# Patient Record
Sex: Female | Born: 1980
Health system: Southern US, Community
[De-identification: ages and names within clinical notes are randomized; demographics above are authoritative.]

## PROBLEM LIST (undated history)

## (undated) DIAGNOSIS — D071 Carcinoma in situ of vulva: Secondary | ICD-10-CM

---

## 2002-03-27 ENCOUNTER — Other Ambulatory Visit: Admission: RE | Admit: 2002-03-27 | Discharge: 2002-03-27 | Payer: Self-pay | Admitting: *Deleted

## 2003-04-24 ENCOUNTER — Other Ambulatory Visit: Admission: RE | Admit: 2003-04-24 | Discharge: 2003-04-24 | Payer: Self-pay | Admitting: *Deleted

## 2003-11-05 ENCOUNTER — Other Ambulatory Visit: Admission: RE | Admit: 2003-11-05 | Discharge: 2003-11-05 | Payer: Self-pay | Admitting: Obstetrics and Gynecology

## 2004-03-26 ENCOUNTER — Other Ambulatory Visit: Admission: RE | Admit: 2004-03-26 | Discharge: 2004-03-26 | Payer: Self-pay | Admitting: Obstetrics and Gynecology

## 2004-10-15 ENCOUNTER — Other Ambulatory Visit: Admission: RE | Admit: 2004-10-15 | Discharge: 2004-10-15 | Payer: Self-pay | Admitting: Obstetrics and Gynecology

## 2005-01-08 ENCOUNTER — Other Ambulatory Visit: Admission: RE | Admit: 2005-01-08 | Discharge: 2005-01-08 | Payer: Self-pay | Admitting: Obstetrics and Gynecology

## 2005-07-27 ENCOUNTER — Other Ambulatory Visit: Admission: RE | Admit: 2005-07-27 | Discharge: 2005-07-27 | Payer: Self-pay | Admitting: Obstetrics and Gynecology

## 2006-11-01 ENCOUNTER — Ambulatory Visit (HOSPITAL_COMMUNITY): Admission: RE | Admit: 2006-11-01 | Discharge: 2006-11-01 | Payer: Self-pay | Admitting: Obstetrics and Gynecology

## 2007-04-15 ENCOUNTER — Inpatient Hospital Stay (HOSPITAL_COMMUNITY): Admission: AD | Admit: 2007-04-15 | Discharge: 2007-04-17 | Payer: Self-pay | Admitting: Obstetrics and Gynecology

## 2008-06-17 LAB — CONVERTED CEMR LAB: Pap Smear: NORMAL

## 2009-12-31 ENCOUNTER — Encounter: Payer: Self-pay | Admitting: Internal Medicine

## 2009-12-31 ENCOUNTER — Ambulatory Visit: Payer: Self-pay | Admitting: Internal Medicine

## 2009-12-31 DIAGNOSIS — K219 Gastro-esophageal reflux disease without esophagitis: Secondary | ICD-10-CM

## 2009-12-31 DIAGNOSIS — R079 Chest pain, unspecified: Secondary | ICD-10-CM

## 2010-06-16 NOTE — Assessment & Plan Note (Signed)
Summary: NEW PT/[facility]/#/LB   Vital Signs:  Patient profile:   30 year old female Height:      66 inches (167.64 cm) Weight:      136.50 pounds (62.05 kg) BMI:     22.11 O2 Sat:      98 % on Room air Temp:     99.1 degrees F (37.28 degrees C) oral Pulse rate:   79 / minute Pulse rhythm:   regular Resp:     16 per minute BP sitting:   120 / 74  (left arm) Cuff size:   regular  Vitals Entered By: Brenton Grills MA (December 31, 2009 3:58 PM)  O2 Flow:  Room air CC: New Pt/Chest pain since Saturday/aj, Heartburn Is Patient Diabetic? No Pain Assessment Patient in pain? no        Primary Care Provider:  Etta Grandchild MD  CC:  New Pt/Chest pain since Saturday/aj and Heartburn.  History of Present Illness:       This is a 30 year old female who presents with Chest pain.  The symptoms began 4 days ago.  On a scale of 1 to 10, the intensity is described as a 2.  The patient reports resting chest pain and indigestion, but denies exertional chest pain, nausea, vomiting, diaphoresis, shortness of breath, palpitations, dizziness, light headedness, and syncope.  The pain is described as intermittent and dull.  The pain is located in the substernal area and the pain does not radiate.  Episodes of chest pain last 10-20 minutes.  The pain is relieved or improved with antacids.    Heartburn      The patient also presents with Heartburn.  The symptoms began 4 days ago.  The intensity is described as moderate.  The patient reports acid reflux and chest pain, but denies sour taste in mouth, epigastric pain, trouble swallowing, weight loss, and weight gain.  The patient denies the following alarm features: melena, dysphagia, hematemesis, vomiting, involuntary weight loss >5%, and history of anemia.  Symptoms are worse with alcohol and spicy foods.  Prior evaluation has included no diagnostic studies.  Treatment that was tried and either found to be ineffective or stopped due to problems include dietary  changes and an antacid.    Preventive Screening-Counseling & Management  Alcohol-Tobacco     Alcohol drinks/day: 2     Alcohol type: beer     >5/day in last 3 mos: no     Alcohol Counseling: not indicated; use of alcohol is not excessive or problematic     Feels need to cut down: no     Feels annoyed by complaints: no     Feels guilty re: drinking: no     Needs 'eye opener' in am: no     Smoking Status: never  Caffeine-Diet-Exercise     Does Patient Exercise: yes  Hep-HIV-STD-Contraception     Hepatitis Risk: no risk noted     HIV Risk: no risk noted     STD Risk: no risk noted  Safety-Violence-Falls     Seat Belt Use: yes     Helmet Use: yes     Firearms in the Home: no firearms in the home     Smoke Detectors: yes     Violence in the Home: no risk noted      Sexual History:  currently monogamous.        Drug Use:  no.        Blood Transfusions:  no.  Current Medications (verified): 1)  None  Allergies (verified): No Known Drug Allergies  Past History:  Past Medical History: Unremarkable  Past Surgical History: Denies surgical history  Family History: Family History Lung cancer  Social History: Occupation: Chief Technology Officer for [employer] Married Never Smoked Alcohol use-yes Drug use-no Regular exercise-yes Smoking Status:  never Hepatitis Risk:  no risk noted HIV Risk:  no risk noted STD Risk:  no risk noted Seat Belt Use:  yes Sexual History:  currently monogamous Blood Transfusions:  no Drug Use:  no Does Patient Exercise:  yes  Review of Systems       The patient complains of severe indigestion/heartburn.  The patient denies anorexia, fever, weight loss, syncope, dyspnea on exertion, peripheral edema, prolonged cough, headaches, hemoptysis, abdominal pain, melena, hematochezia, suspicious skin lesions, difficulty walking, depression, enlarged lymph nodes, and angioedema.    Physical Exam  General:  alert, well-developed,  well-nourished, well-hydrated, appropriate dress, normal appearance, healthy-appearing, cooperative to examination, and good hygiene.   Head:  normocephalic, atraumatic, no abnormalities observed, and no abnormalities palpated.   Eyes:  No corneal or conjunctival inflammation noted. EOMI. Perrla. Funduscopic exam benign, without hemorrhages, exudates or papilledema. Vision grossly normal. Neck:  supple, full ROM, no masses, no thyromegaly, no JVD, normal carotid upstroke, no carotid bruits, no cervical lymphadenopathy, and no neck tenderness.   Lungs:  normal respiratory effort, no intercostal retractions, no accessory muscle use, normal breath sounds, no dullness, no fremitus, no crackles, and no wheezes.   Heart:  normal rate, regular rhythm, no murmur, no gallop, no rub, and no JVD.   Abdomen:  soft, non-tender, normal bowel sounds, no distention, no masses, no guarding, no rigidity, no rebound tenderness, no abdominal hernia, no inguinal hernia, no hepatomegaly, and no splenomegaly.   Msk:  No deformity or scoliosis noted of thoracic or lumbar spine.   Pulses:  R and L carotid,radial,femoral,dorsalis pedis and posterior tibial pulses are full and equal bilaterally Extremities:  No clubbing, cyanosis, edema, or deformity noted with normal full range of motion of all joints.   Neurologic:  No cranial nerve deficits noted. Station and gait are normal. Plantar reflexes are down-going bilaterally. DTRs are symmetrical throughout. Sensory, motor and coordinative functions appear intact. Skin:  turgor normal, color normal, no rashes, no suspicious lesions, no ecchymoses, no petechiae, no purpura, no ulcerations, and no edema.   Cervical Nodes:  no anterior cervical adenopathy and no posterior cervical adenopathy.   Axillary Nodes:  no R axillary adenopathy and no L axillary adenopathy.   Psych:  Cognition and judgment appear intact. Alert and cooperative with normal attention span and concentration. No  apparent delusions, illusions, hallucinations   Impression & Recommendations:  Problem # 1:  CHEST PAIN (ICD-786.50) Assessment New  this sounds like it is caused by GERD, her EKG is normal today.  Orders: EKG w/ Interpretation (93000)  Problem # 2:  GERD (ICD-530.81) Assessment: New  Her updated medication list for this problem includes:    Dexilant 60 Mg Cpdr (Dexlansoprazole) ..... One by mouth once daily for heartburn  Orders: EKG w/ Interpretation (93000)  Complete Medication List: 1)  Dexilant 60 Mg Cpdr (Dexlansoprazole) .... One by mouth once daily for heartburn  Patient Instructions: 1)  Please schedule a follow-up appointment in 2 months. 2)  Avoid foods high in acid (tomatoes, citrus juices, spicy foods). Avoid eating within two hours of lying down or before exercising. Do not over eat; try smaller more frequent meals. Elevate head of  bed twelve inches when sleeping. Prescriptions: DEXILANT 60 MG CPDR (DEXLANSOPRAZOLE) One by mouth once daily for heartburn  #30 x 0   Entered and Authorized by:   Etta Grandchild MD   Signed by:   Etta Grandchild MD on 12/31/2009   Method used:   Samples Given   RxID:   [id number]   Preventive Care Screening  Pap Smear:    Date:  06/17/2008    Results:  normal

## 2011-02-22 LAB — CBC
Hemoglobin: 9.1 — ABNORMAL LOW
MCHC: 34.1
RBC: 2.74 — ABNORMAL LOW
RDW: 13.8

## 2011-02-22 LAB — COMPREHENSIVE METABOLIC PANEL
ALT: 15
AST: 28
Alkaline Phosphatase: 92
Calcium: 8.8
GFR calc Af Amer: >60
Glucose, Bld: 118 — ABNORMAL HIGH
Potassium: 3.3 — ABNORMAL LOW
Sodium: 137
Total Protein: 5.2 — ABNORMAL LOW

## 2011-02-23 LAB — COMPREHENSIVE METABOLIC PANEL
ALT: 14
BUN: 6
CO2: 20
Calcium: 8.6
Creatinine, Ser: 0.61
GFR calc non Af Amer: >60
Glucose, Bld: 108 — ABNORMAL HIGH
Total Protein: 6.1

## 2011-02-23 LAB — URINALYSIS, DIPSTICK ONLY
Bilirubin Urine: NEGATIVE
Leukocytes, UA: NEGATIVE
Nitrite: NEGATIVE
Specific Gravity, Urine: <1.005 — ABNORMAL LOW
Urobilinogen, UA: 0.2

## 2011-02-23 LAB — CBC
HCT: 34.6 — ABNORMAL LOW
Hemoglobin: 12
MCHC: 33.8
MCHC: 34.7
MCV: 96
RBC: 2.77 — ABNORMAL LOW
RBC: 3.6 — ABNORMAL LOW
RDW: 13.5

## 2011-02-23 LAB — LACTATE DEHYDROGENASE: LDH: 157

## 2011-02-23 LAB — RPR: RPR Ser Ql: NONREACTIVE

## 2011-02-23 LAB — URIC ACID: Uric Acid, Serum: 5.3

## 2011-04-19 ENCOUNTER — Emergency Department
Admission: EM | Admit: 2011-04-19 | Discharge: 2011-04-19 | Disposition: A | Discharge status: Home / Self Care | Payer: 59 | Source: Home / Self Care | Attending: Family Medicine | Admitting: Family Medicine

## 2011-04-19 ENCOUNTER — Encounter: Payer: Self-pay | Admitting: Emergency Medicine

## 2011-04-19 DIAGNOSIS — J069 Acute upper respiratory infection, unspecified: Secondary | ICD-10-CM

## 2011-04-19 MED ORDER — AZITHROMYCIN 250 MG PO TABS: ORAL_TABLET | ORAL | Status: AC

## 2011-04-19 MED ORDER — BENZONATATE 200 MG PO CAPS: 200.0000 mg | ORAL_CAPSULE | Freq: Every day | ORAL | Status: AC

## 2011-04-19 NOTE — ED Provider Notes (Signed)
History     CSN: [id number] Arrival date & time: 04/19/2011  1:07 PM   First MD Initiated Contact with Patient 04/19/11 1320      Chief Complaint  Patient presents with  . URI      HPI Comments: Complains of mild URI symptoms for one week that started with a scratchy throat, followed by a cough, worse at night.  No fever.  She has not had a flu shot, and does not remember her last tetanus shot.  Patient is a 30 y.o. female presenting with URI.  URI    History reviewed. No pertinent past medical history.  No past surgical history on file.  No family history on file.  History  Substance Use Topics  . Smoking status: Not on file  . Smokeless tobacco: Not on file  . Alcohol Use: Not on file    OB History    Grav Para Term Preterm Abortions TAB SAB Ect Mult Living                  Review of Systems No sore throat at present.  cough No pleuritic pain No wheezing + mild nasal congestion No post-nasal drainage No sinus pain/pressure No itchy/red eyes ? earache No hemoptysis No SOB No fever/chills No nausea No vomiting No abdominal pain No diarrhea No urinary symptoms No skin rashes + fatigue No myalgias No headache Used OTC meds without relief  Allergies  Review of patient's allergies indicates no known allergies.  Home Medications   Current Outpatient Rx  Name Route Sig Dispense Refill  . AZITHROMYCIN 250 MG PO TABS  Take 2 tabs today; then begin one tab once daily for 4 more days. (Rx void after 04/26/11)   DEA # [id number] each 0  . BENZONATATE 200 MG PO CAPS Oral Take 1 capsule (200 mg total) by mouth at bedtime. Take as needed for cough 12 capsule 0    BP 130/76  Pulse 76  Temp(Src) 99.1 F (37.3 C) (Oral)  Resp 16  Ht 5\' 6"  (1.676 m)  Wt 144 lb (65.318 kg)  BMI 23.24 kg/m2  SpO2 100%  LMP 04/05/2011  Physical Exam Nursing notes and Vital Signs reviewed. Appearance:  Patient appears healthy, stated age, and in no acute distress    Eyes:  Pupils are equal, round, and reactive to light and accomodation.  Extraocular movement is intact.  Conjunctivae are not inflamed  Ears:  Canals normal.  Tympanic membranes normal.  Nose:  Mildly congested turbinates.  No sinus tenderness.   Pharynx:  Normal  Neck:  Supple.  Slightly tender shotty anterior/posterior nodes are palpated bilaterally  Lungs:  Clear to auscultation.  Breath sounds are equal.  Heart:  Regular rate and rhythm without murmurs, rubs, or gallops.  Abdomen:  Nontender without masses or hepatosplenomegaly.  Bowel sounds are present.  No CVA or flank tenderness.  Extremities:  No edema.  Pedal pulses are full and equal.  Skin:  No rash present.   ED Course  Procedures none      1. Acute upper respiratory infections of unspecified site       MDM  There is no evidence of bacterial infection today.   Treat symptomatically for now:  Increase fluid intake, begin expectorant/decongestant, topical decongestant, saline nasal spray/saline irrigation, cough suppressant at bedtime. If fever/chills persist, or if not improving 5 days begin Z-pack (given Rx to hold).  Followup with PCP if not improving 7 to 10 days.  Recommend flu  shot and Tdap when well.       Donna Christen, MD 04/19/11 [phone]

## 2011-04-19 NOTE — ED Notes (Signed)
Bi lateral ear pain, cough, scratchy throat x 1 week

## 2011-06-11 HISTORY — PX: DILATION AND CURETTAGE, DIAGNOSTIC / THERAPEUTIC: SUR384

## 2013-03-13 ENCOUNTER — Ambulatory Visit (INDEPENDENT_AMBULATORY_CARE_PROVIDER_SITE_OTHER): Payer: 59 | Admitting: Sports Medicine

## 2013-03-13 ENCOUNTER — Encounter: Payer: Self-pay | Admitting: Sports Medicine

## 2013-03-13 ENCOUNTER — Ambulatory Visit (INDEPENDENT_AMBULATORY_CARE_PROVIDER_SITE_OTHER): Payer: 59

## 2013-03-13 VITALS — BP 130/79 | HR 85 | Wt 140.0 lb

## 2013-03-13 DIAGNOSIS — M7061 Trochanteric bursitis, right hip: Secondary | ICD-10-CM

## 2013-03-13 DIAGNOSIS — M25559 Pain in unspecified hip: Secondary | ICD-10-CM

## 2013-03-13 DIAGNOSIS — M76899 Other specified enthesopathies of unspecified lower limb, excluding foot: Secondary | ICD-10-CM

## 2013-03-13 MED ORDER — MELOXICAM 15 MG PO TABS: ORAL_TABLET | ORAL | Status: DC

## 2013-03-13 NOTE — Progress Notes (Deleted)
  Subjective:    CC: Establish care.   HPI:   Past medical history, Surgical history, Family history not pertinant except as noted below, Social history, Allergies, and medications have been entered into the medical record, reviewed, and no changes needed.   Review of Systems: No headache, visual changes, nausea, vomiting, diarrhea, constipation, dizziness, abdominal pain, skin rash, fevers, chills, night sweats, swollen lymph nodes, weight loss, chest pain, body aches, joint swelling, muscle aches, shortness of breath, mood changes, visual or auditory hallucinations.  Objective:    General: Well Developed, well nourished, and in no acute distress.  Neuro: Alert and oriented x3, extra-ocular muscles intact, sensation grossly intact.  HEENT: Normocephalic, atraumatic, pupils equal round reactive to light, neck supple, no masses, no lymphadenopathy, thyroid nonpalpable.  Skin: Warm and dry, no rashes noted.  Cardiac: Regular rate and rhythm, no murmurs rubs or gallops.  Respiratory: Clear to auscultation bilaterally. Not using accessory muscles, speaking in full sentences.  Abdominal: Soft, nontender, nondistended, positive bowel sounds, no masses, no organomegaly.  Musculoskeletal: Shoulder, elbow, wrist, hip, knee, ankle stable, and with full range of motion.    Impression and Recommendations:    The patient was counselled, risk factors were discussed, anticipatory guidance given.

## 2013-03-13 NOTE — Patient Instructions (Signed)
Hip Rehabilitation Protocol:  1.  Side leg raises.  3x30 with no weight, then 3x15 with 2 lb ankle weight, then 3x15 with 5 lb ankle weight 2.  Standing hip rotation.  3x30 with no weight, then 3x15 with 2 lb ankle weight, then 3x15 with 5 lb ankle weight. 3.  Side step ups.  3x30 with no weight, then 3x15 with 5 lbs in backpack, then 3x15 with 10 lbs in backpack. 

## 2013-03-13 NOTE — Assessment & Plan Note (Signed)
Persistent, posttraumatic, and likely perseverative by abnormal biomechanics. Mobic, aggressive hip abductor rehabilitation. Hip and pelvic x-rays. Return in 4 weeks, injection if no better.

## 2013-03-13 NOTE — Progress Notes (Signed)
  Subjective:    CC: Hip pain  HPI: This is a pleasant 32 year old female here for evaluation of right hip pain. She slipped and fell down a flight of stairs about 8 months ago and had pain, bruising and swelling at the time. She treated the pain with ice but did not see a doctor initially. She has had no x-rays of the hip. She had a limp initially and was unable to run or exercise. Her pain in the right hip and lateral thigh was persistent for several months. It gradually improved but began to worsen again about 2 months ago. The pain is moderate and intermittent, worse when standing up out of a chair. She has no pain with walking or running.   Past medical history, Surgical history, Family history not pertinant except as noted below, Social history, Allergies, and medications have been entered into the medical record, reviewed, and no changes needed.   Review of Systems: No headache, visual changes, nausea, vomiting, diarrhea, constipation, dizziness, abdominal pain, skin rash, fevers, chills, night sweats, swollen lymph nodes, weight loss, chest pain, body aches, joint swelling, muscle aches, shortness of breath, mood changes, visual or auditory hallucinations.  Objective:    General: Well Developed, well nourished, and in no acute distress.  Neuro: Alert and oriented x3, extra-ocular muscles intact, sensation grossly intact.  HEENT: Normocephalic, atraumatic, pupils equal round reactive to light, neck supple, no masses, no lymphadenopathy, thyroid nonpalpable.  Skin: Warm and dry, no rashes noted.  Cardiac: Regular rate and rhythm, no murmurs rubs or gallops.  Respiratory: Clear to auscultation bilaterally. Not using accessory muscles, speaking in full sentences.  Abdominal: Soft, nontender, nondistended, positive bowel sounds, no masses, no organomegaly.  Right Hip: ROM IR: 45 Deg, ER: 45 Deg, Flexion: 120 Deg, Extension: 100 Deg, Abduction: 30 Deg, Adduction: 45 Deg Strength IR: 5/5, ER:  5/5, Flexion: 5/5, Extension: 4/5, Abduction: 4-/5, Adduction: 5/5 Pelvic alignment unremarkable to inspection and palpation. Standing hip rotation and gait without trendelenburg sign / unsteadiness. Tender to palpation over the greater trochanter and lateral thigh Pain with FABER No SI joint tenderness and normal minimal SI movement.  X-rays were reviewed and are negative for fracture or degenerative changes.  Impression and Recommendations:    The patient was counselled, risk factors were discussed, anticipatory guidance given.  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  Plan: 1. X-ray right hip today 2. Meloxicam daily for 2 weeks, then as needed for pain 3. Daily home exercises to strengthen hip abductors 4. Return for follow-up in 4 weeks to evaluate response and need for glucocorticoid injection  This note was originally written by Karin Lieu MS3.

## 2013-04-10 ENCOUNTER — Ambulatory Visit: Payer: 59 | Admitting: Sports Medicine

## 2014-05-17 HISTORY — PX: LEEP: SHX91

## 2014-08-20 ENCOUNTER — Encounter: Payer: Self-pay | Admitting: Sports Medicine

## 2014-08-20 ENCOUNTER — Ambulatory Visit (INDEPENDENT_AMBULATORY_CARE_PROVIDER_SITE_OTHER): Payer: 59

## 2014-08-20 ENCOUNTER — Ambulatory Visit (INDEPENDENT_AMBULATORY_CARE_PROVIDER_SITE_OTHER): Payer: 59 | Admitting: Sports Medicine

## 2014-08-20 VITALS — BP 138/86 | HR 85 | Ht 66.0 in | Wt 149.0 lb

## 2014-08-20 DIAGNOSIS — M25572 Pain in left ankle and joints of left foot: Secondary | ICD-10-CM

## 2014-08-20 DIAGNOSIS — M25475 Effusion, left foot: Secondary | ICD-10-CM | POA: Diagnosis not present

## 2014-08-20 DIAGNOSIS — M2012 Hallux valgus (acquired), left foot: Secondary | ICD-10-CM

## 2014-08-20 MED ORDER — MELOXICAM 15 MG PO TABS: ORAL_TABLET | ORAL | Status: DC

## 2014-08-20 NOTE — Progress Notes (Signed)
   Subjective:    I'm seeing this patient as a consultation for:  Dr. Ronnald Ramp  CC: left toe pain  HPI: This is a pleasant 34 year old female, she comes in with a 3 month history of pain and swelling in her left great toe, metatarsophalangeal joint, moderate, persistent without radiation. Pain began suddenly without trauma, she denies any fevers or chills.  Past medical history, Surgical history, Family history not pertinant except as noted below, Social history, Allergies, and medications have been entered into the medical record, reviewed, and no changes needed.   Review of Systems: No headache, visual changes, nausea, vomiting, diarrhea, constipation, dizziness, abdominal pain, skin rash, fevers, chills, night sweats, weight loss, swollen lymph nodes, body aches, joint swelling, muscle aches, chest pain, shortness of breath, mood changes, visual or auditory hallucinations.   Objective:   General: Well Developed, well nourished, and in no acute distress.  Neuro/Psych: Alert and oriented x3, extra-ocular muscles intact, able to move all 4 extremities, sensation grossly intact. Skin: Warm and dry, no rashes noted.  Respiratory: Not using accessory muscles, speaking in full sentences, trachea midline.  Cardiovascular: Pulses palpable, no extremity edema. Abdomen: Does not appear distended. Left Foot: No visible erythema or swelling. Range of motion is full in all directions. Strength is 5/5 in all directions. No hallux valgus. Mild pes cavus bilaterally No abnormal callus noted. No pain over the navicular prominence, or base of fifth metatarsal. No tenderness to palpation of the calcaneal insertion of plantar fascia. No pain at the Achilles insertion. No pain over the calcaneal bursa. No pain of the retrocalcaneal bursa. Visible swelling with mild erythema over the first metatarsophalangeal joint, with visible hallux valgus without hallux limitus or rigidus. No tenderness palpation over  interphalangeal joints. No pain with compression of the metatarsal heads. Neurovascularly intact distally.  Procedure: Real-time Ultrasound Guided Injection of left first metatarsophalangeal joint Device: GE Logiq E  Verbal informed consent obtained.  Time-out conducted.  Noted no overlying erythema, induration, or other signs of local infection.  Skin prepped in a sterile fashion.  Local anesthesia: Topical Ethyl chloride.  With sterile technique and under real time ultrasound guidance:  Noted extensive synovitis, 25-gauge needle advanced into the joint, 0.5 mL kenalog 40, 0.5 mL lidocaine injected easily. Completed without difficulty  Pain immediately resolved suggesting accurate placement of the medication.  Advised to call if fevers/chills, erythema, induration, drainage, or persistent bleeding.  Images permanently stored and available for review in the ultrasound unit.  Impression: Technically successful ultrasound guided injection.  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  Impression and Recommendations:   This case required medical decision making of moderate complexity.

## 2014-08-20 NOTE — Assessment & Plan Note (Signed)
Considering duration for 3 months I do not suspect gouty arthropathy. This is likely synovitis from hallux valgus. Injection as above. Meloxicam. X-rays. Return for custom orthotics.

## 2014-09-05 ENCOUNTER — Encounter: Payer: Self-pay | Admitting: Sports Medicine

## 2014-09-05 ENCOUNTER — Ambulatory Visit (INDEPENDENT_AMBULATORY_CARE_PROVIDER_SITE_OTHER): Payer: 59 | Admitting: Sports Medicine

## 2014-09-05 VITALS — BP 123/77 | HR 82 | Wt 146.0 lb

## 2014-09-05 DIAGNOSIS — M2012 Hallux valgus (acquired), left foot: Secondary | ICD-10-CM | POA: Diagnosis not present

## 2014-09-05 NOTE — Assessment & Plan Note (Signed)
Pain-free after metatarsophalangeal injection, custom orthotics as above.  Return in one month.

## 2014-09-05 NOTE — Progress Notes (Signed)

## 2014-10-03 ENCOUNTER — Ambulatory Visit: Payer: 59 | Admitting: Sports Medicine

## 2014-10-15 ENCOUNTER — Encounter: Payer: Self-pay | Admitting: Family Medicine

## 2014-10-15 ENCOUNTER — Ambulatory Visit (INDEPENDENT_AMBULATORY_CARE_PROVIDER_SITE_OTHER): Payer: 59 | Admitting: Family Medicine

## 2014-10-15 VITALS — BP 132/81 | HR 86 | Wt 147.0 lb

## 2014-10-15 DIAGNOSIS — J029 Acute pharyngitis, unspecified: Secondary | ICD-10-CM | POA: Diagnosis not present

## 2014-10-15 LAB — POCT RAPID STREP A (OFFICE): Rapid Strep A Screen: NEGATIVE

## 2014-10-15 NOTE — Progress Notes (Signed)
CC: Debbie Hayden is a 34 y.o. female is here for Sore Throat   Subjective: HPI:  Awoke with a sore throat at 2 AM today, moderate in severity, greatly improved by ibuprofen. No other interventions. Reports being in her regular state of health when she went to bed last night. Denies difficulty swallowing, choking, shortness of breath, change in voice, rash and headache nor nasal congestion. No fevers or chills.   Review Of Systems Outlined In HPI  No past medical history on file.  No past surgical history on file. No family history on file.  History   Social History  . Marital Status: Married    Spouse Name: N/A  . Number of Children: N/A  . Years of Education: N/A   Occupational History  . Not on file.   Social History Main Topics  . Smoking status: Never Smoker   . Smokeless tobacco: Not on file  . Alcohol Use: Not on file  . Drug Use: Not on file  . Sexual Activity: Not on file   Other Topics Concern  . Not on file   Social History Narrative     Objective: BP 132/81 mmHg  Pulse 86  Wt 147 lb (66.679 kg)  General: Alert and Oriented, No Acute Distress HEENT: Pupils equal, round, reactive to light. Conjunctivae clear.  External ears unremarkable, canals clear with intact TMs with appropriate landmarks.  Middle ear appears open without effusion. Pink inferior turbinates.  Moist mucous membranes, pharynx without inflammation nor lesions.  Neck supple without palpable lymphadenopathy nor abnormal masses. Uvula is midline Lungs: Clear to couple work of breathing Cardiac: Regular rate and rhythm.   Assessment & Plan: Debbie Hayden was seen today for sore throat.  Diagnoses and all orders for this visit:  Acute pharyngitis, unspecified pharyngitis type Orders: -     POCT rapid strep A  Sore throat   Viral pharyngitis: She is happy with response to ibuprofen and will continue using this on an as-needed basis. Discussed signs and symptoms that would be suggestive  of a bacterial infection and to call me if these symptoms arise for a antibiotic  No Follow-up on file.

## 2015-02-06 ENCOUNTER — Encounter: Payer: Self-pay | Admitting: Sports Medicine

## 2015-02-06 ENCOUNTER — Ambulatory Visit (INDEPENDENT_AMBULATORY_CARE_PROVIDER_SITE_OTHER): Payer: 59 | Admitting: Sports Medicine

## 2015-02-06 VITALS — BP 128/76 | HR 82 | Wt 149.0 lb

## 2015-02-06 DIAGNOSIS — M659 Synovitis and tenosynovitis, unspecified: Secondary | ICD-10-CM

## 2015-02-06 DIAGNOSIS — M65979 Unspecified synovitis and tenosynovitis, unspecified ankle and foot: Secondary | ICD-10-CM | POA: Insufficient documentation

## 2015-02-06 NOTE — Assessment & Plan Note (Signed)
Noted joint effusion in the third MTP, lesser so in the second and fourth, there was also no increased Doppler flow along the periosteum, and no tendon sheath effusion in the extensor digitorum. Continue meloxicam. Wear supportive/rigid soled shoes, and icing 20 minutes 3-4 times per day. She does have a trip coming up to [place].  After further discussion we will inject the third MTP. Return after trip to [place].

## 2015-02-06 NOTE — Progress Notes (Signed)
  Subjective:    CC: left foot pain  HPI: This is a pleasant 34 year old female, she did a 3 mile walk, and then played soccer with her son, only to indent with swelling and pain over the dorsum of her left foot, localized over the shaft of the third metatarsal down to the metatarsophalangeal joint. Pain is severe, persistent with radiation into the ankle.  Past medical history, Surgical history, Family history not pertinant except as noted below, Social history, Allergies, and medications have been entered into the medical record, reviewed, and no changes needed.   Review of Systems: No fevers, chills, night sweats, weight loss, chest pain, or shortness of breath.   Objective:    General: Well Developed, well nourished, and in no acute distress.  Neuro: Alert and oriented x3, extra-ocular muscles intact, sensation grossly intact.  HEENT: Normocephalic, atraumatic, pupils equal round reactive to light, neck supple, no masses, no lymphadenopathy, thyroid nonpalpable.  Skin: Warm and dry, no rashes. Cardiac: Regular rate and rhythm, no murmurs rubs or gallops, no lower extremity edema.  Respiratory: Clear to auscultation bilaterally. Not using accessory muscles, speaking in full sentences. Left Foot: Visibly swollen. Range of motion is full in all directions. Strength is 5/5 in all directions. No hallux valgus. No pes cavus or pes planus. No abnormal callus noted. No pain over the navicular prominence, or base of fifth metatarsal. No tenderness to palpation of the calcaneal insertion of plantar fascia. No pain at the Achilles insertion. No pain over the calcaneal bursa. No pain of the retrocalcaneal bursa. No tenderness to palpation over the tarsals, metatarsals, or phalanges. No hallux rigidus or limitus. Tender to palpation over the dorsum of the third metatarsal as well as the third metatarsophalangeal joint. No pain with compression of the metatarsal heads. Neurovascularly intact  distally.  Procedure: Real-time Ultrasound Guided Injection of left third MTP Device: GE Logiq E  Verbal informed consent obtained.  Time-out conducted.  Noted no overlying erythema, induration, or other signs of local infection.  Skin prepped in a sterile fashion.  Local anesthesia: Topical Ethyl chloride.  With sterile technique and under real time ultrasound guidance:  Noted left third MTP effusion, 25-gauge needle advanced here, and 1/2 mL Kenalog 40, 1/2 mL lidocaine injected easily. Completed without difficulty  Pain immediately resolved suggesting accurate placement of the medication.  Advised to call if fevers/chills, erythema, induration, drainage, or persistent bleeding.  Images permanently stored and available for review in the ultrasound unit.  Impression: Technically successful ultrasound guided injection.  Impression and Recommendations:

## 2015-06-19 MED FILL — TRI-LO-SPRINTEC TABLET: 0.18/0.215/ | 84 days supply | Qty: 84 | Fill #2

## 2015-07-08 DIAGNOSIS — Z01419 Encounter for gynecological examination (general) (routine) without abnormal findings: Secondary | ICD-10-CM | POA: Diagnosis not present

## 2015-07-08 DIAGNOSIS — C530 Malignant neoplasm of endocervix: Secondary | ICD-10-CM | POA: Diagnosis not present

## 2015-07-08 DIAGNOSIS — Z8541 Personal history of malignant neoplasm of cervix uteri: Secondary | ICD-10-CM | POA: Diagnosis not present

## 2015-09-10 MED FILL — TRI-LO-SPRINTEC TABLET: 0.18/0.215/ | 84 days supply | Qty: 84 | Fill #3

## 2015-12-04 MED FILL — TRI-LO-SPRINTEC TABLET: 0.18/0.215/ | 28 days supply | Qty: 28 | Fill #4

## 2015-12-18 ENCOUNTER — Encounter: Payer: Self-pay | Admitting: Sports Medicine

## 2015-12-18 ENCOUNTER — Ambulatory Visit (INDEPENDENT_AMBULATORY_CARE_PROVIDER_SITE_OTHER): Payer: 59 | Admitting: Sports Medicine

## 2015-12-18 VITALS — BP 138/85 | HR 90 | Resp 18 | Wt 156.3 lb

## 2015-12-18 DIAGNOSIS — R635 Abnormal weight gain: Secondary | ICD-10-CM | POA: Diagnosis not present

## 2015-12-18 DIAGNOSIS — Z0000 Encounter for general adult medical examination without abnormal findings: Secondary | ICD-10-CM | POA: Insufficient documentation

## 2015-12-18 DIAGNOSIS — Z299 Encounter for prophylactic measures, unspecified: Secondary | ICD-10-CM

## 2015-12-18 DIAGNOSIS — Z23 Encounter for immunization: Secondary | ICD-10-CM | POA: Diagnosis not present

## 2015-12-18 LAB — LIPID PANEL
Cholesterol: 202 mg/dL — ABNORMAL HIGH (ref 125–200)
HDL: 108 mg/dL (ref >=46)
LDL Cholesterol: 74 mg/dL (ref <130)
Total CHOL/HDL Ratio: 1.9 Ratio (ref <=5.0)
Triglycerides: 99 mg/dL (ref <150)
VLDL: 20 mg/dL (ref <30)

## 2015-12-18 LAB — COMPREHENSIVE METABOLIC PANEL WITH GFR
ALT: 33 U/L — ABNORMAL HIGH (ref 6–29)
AST: 25 U/L (ref 10–30)
Albumin: 4.5 g/dL (ref 3.6–5.1)
Alkaline Phosphatase: 40 U/L (ref 33–115)
Calcium: 9.3 mg/dL (ref 8.6–10.2)
Sodium: 138 mmol/L (ref 135–146)
Total Bilirubin: 0.5 mg/dL (ref 0.2–1.2)
Total Protein: 7.4 g/dL (ref 6.1–8.1)

## 2015-12-18 LAB — CBC
HCT: 38.7 % (ref 35.0–45.0)
Hemoglobin: 12.7 g/dL (ref 11.7–15.5)
MCH: 32.2 pg (ref 27.0–33.0)
MCHC: 32.8 g/dL (ref 32.0–36.0)
MCV: 98.2 fL (ref 80.0–100.0)
MPV: 9.4 fL (ref 7.5–12.5)
Platelets: 227 10*3/uL (ref 140–400)
RBC: 3.94 MIL/uL (ref 3.80–5.10)
RDW: 12.6 % (ref 11.0–15.0)
WBC: 10 10*3/uL (ref 3.8–10.8)

## 2015-12-18 LAB — COMPREHENSIVE METABOLIC PANEL
BUN: 11 mg/dL (ref 7–25)
CO2: 24 mmol/L (ref 20–31)
Chloride: 102 mmol/L (ref 98–110)
Creat: 0.68 mg/dL (ref 0.50–1.10)
Glucose, Bld: 89 mg/dL (ref 65–99)
Potassium: 4.2 mmol/L (ref 3.5–5.3)

## 2015-12-18 LAB — TSH: TSH: 1.37 m[IU]/L

## 2015-12-18 MED ORDER — PHENTERMINE HCL 37.5 MG PO TABS: ORAL_TABLET | ORAL | 0 refills | Status: DC

## 2015-12-18 MED FILL — PHENTERMINE 37.5 MG TABLET: 37.5 | 30 days supply | Qty: 30 | Fill #0

## 2015-12-18 NOTE — Assessment & Plan Note (Signed)
Cervical cancer screening is coming up. Tetanus vaccine today.

## 2015-12-18 NOTE — Progress Notes (Signed)
  Subjective:    CC: Abnormal weight gain  HPI: This is a pleasant 35 year old female, she has struggled with gaining weight over the past few years despite riding an exercise, she is here to discuss pharmacologic assistance with weight loss. She does have bilateral foot and ankle pain  Past medical history, Surgical history, Family history not pertinant except as noted below, Social history, Allergies, and medications have been entered into the medical record, reviewed, and no changes needed.   Review of Systems: No fevers, chills, night sweats, weight loss, chest pain, or shortness of breath.   Objective:    General: Well Developed, well nourished, and in no acute distress.  Neuro: Alert and oriented x3, extra-ocular muscles intact, sensation grossly intact.  HEENT: Normocephalic, atraumatic, pupils equal round reactive to light, neck supple, no masses, no lymphadenopathy, thyroid nonpalpable.  Skin: Warm and dry, no rashes. Cardiac: Regular rate and rhythm, no murmurs rubs or gallops, no lower extremity edema.  Respiratory: Clear to auscultation bilaterally. Not using accessory muscles, speaking in full sentences.  Impression and Recommendations:    Abnormal weight gain Starting phentermine, return monthly for weight checks and refills. Checking routine blood work.  Preventive measure Cervical cancer screening is coming up. Tetanus vaccine today.  I spent 25 minutes with this patient, greater than 50% was face-to-face time counseling regarding the above diagnoses

## 2015-12-18 NOTE — Addendum Note (Signed)
Addended by: Elizabeth Sauer on: 12/18/2015 01:12 PM   Modules accepted: Orders

## 2015-12-18 NOTE — Assessment & Plan Note (Signed)
Starting phentermine, return monthly for weight checks and refills. Checking routine blood work.

## 2015-12-19 LAB — HEMOGLOBIN A1C
Hgb A1c MFr Bld: 4.9 % (ref <5.7)
Mean Plasma Glucose: 94 mg/dL

## 2015-12-19 LAB — HIV ANTIBODY (ROUTINE TESTING W REFLEX): HIV 1&2 Ab, 4th Generation: NONREACTIVE

## 2015-12-19 LAB — VITAMIN D 25 HYDROXY (VIT D DEFICIENCY, FRACTURES): Vit D, 25-Hydroxy: 40 ng/mL (ref 30–100)

## 2015-12-24 ENCOUNTER — Encounter: Payer: Self-pay | Admitting: Sports Medicine

## 2015-12-25 ENCOUNTER — Ambulatory Visit: Payer: 59 | Admitting: Sports Medicine

## 2016-01-01 MED FILL — TRI-LO-SPRINTEC TABLET: 0.18/0.215/ | 56 days supply | Qty: 56 | Fill #0

## 2016-01-15 ENCOUNTER — Ambulatory Visit (INDEPENDENT_AMBULATORY_CARE_PROVIDER_SITE_OTHER): Payer: 59 | Admitting: Sports Medicine

## 2016-01-15 ENCOUNTER — Ambulatory Visit: Payer: 59 | Admitting: Sports Medicine

## 2016-01-15 VITALS — BP 118/80 | HR 92 | Resp 16 | Wt 149.0 lb

## 2016-01-15 DIAGNOSIS — R635 Abnormal weight gain: Secondary | ICD-10-CM

## 2016-01-15 DIAGNOSIS — R634 Abnormal weight loss: Secondary | ICD-10-CM

## 2016-01-15 MED ORDER — PHENTERMINE HCL 37.5 MG PO TABS: ORAL_TABLET | ORAL | 0 refills | Status: DC

## 2016-01-15 NOTE — Progress Notes (Signed)
Patient is here for blood pressure and weight check. Denies trouble sleeping, palpitations or medication problems.  Patient has lost weight. A refill for phentermine will be faxed to pharmacy. Patient advised to schedule a follow up with nurse in 30 days. pak

## 2016-01-15 NOTE — Assessment & Plan Note (Signed)
Good weight loss after the first month, return to see me in one month for next weight check.

## 2016-01-16 ENCOUNTER — Ambulatory Visit: Payer: 59 | Admitting: Sports Medicine

## 2016-01-22 MED FILL — PHENTERMINE 37.5 MG TABLET: 37.5 | 30 days supply | Qty: 30 | Fill #0

## 2016-01-28 DIAGNOSIS — Z6824 Body mass index (BMI) 24.0-24.9, adult: Secondary | ICD-10-CM | POA: Diagnosis not present

## 2016-01-28 DIAGNOSIS — Z01419 Encounter for gynecological examination (general) (routine) without abnormal findings: Secondary | ICD-10-CM | POA: Diagnosis not present

## 2016-02-12 ENCOUNTER — Ambulatory Visit (INDEPENDENT_AMBULATORY_CARE_PROVIDER_SITE_OTHER): Payer: 59 | Admitting: Sports Medicine

## 2016-02-12 ENCOUNTER — Encounter: Payer: Self-pay | Admitting: Sports Medicine

## 2016-02-12 DIAGNOSIS — R635 Abnormal weight gain: Secondary | ICD-10-CM

## 2016-02-12 DIAGNOSIS — R634 Abnormal weight loss: Secondary | ICD-10-CM

## 2016-02-12 MED ORDER — PHENTERMINE HCL 37.5 MG PO TABS: ORAL_TABLET | ORAL | 0 refills | Status: DC

## 2016-02-12 NOTE — Progress Notes (Signed)
  Subjective:    CC: Weight check  HPI: This is a pleasant 35 year old female, she comes in for follow-up of abnormal weight gain, she's lost an additional 6 pounds after 2 months of phentermine. Happy with results so far. Her goal weight remains 135 pounds.  Past medical history:  Negative.  See flowsheet/record as well for more information.  Surgical history: Negative.  See flowsheet/record as well for more information.  Family history: Negative.  See flowsheet/record as well for more information.  Social history: Negative.  See flowsheet/record as well for more information.  Allergies, and medications have been entered into the medical record, reviewed, and no changes needed.   Review of Systems: No fevers, chills, night sweats, weight loss, chest pain, or shortness of breath.   Objective:    General: Well Developed, well nourished, and in no acute distress.  Neuro: Alert and oriented x3, extra-ocular muscles intact, sensation grossly intact.  HEENT: Normocephalic, atraumatic, pupils equal round reactive to light, neck supple, no masses, no lymphadenopathy, thyroid nonpalpable.  Skin: Warm and dry, no rashes. Cardiac: Regular rate and rhythm, no murmurs rubs or gallops, no lower extremity edema.  Respiratory: Clear to auscultation bilaterally. Not using accessory muscles, speaking in full sentences.  Impression and Recommendations:    Abnormal weight gain 6 additional pound weight loss, entering the third month. Return to see me in one month. Goal weight is 135 pounds.

## 2016-02-12 NOTE — Assessment & Plan Note (Signed)
6 additional pound weight loss, entering the third month. Return to see me in one month. Goal weight is 135 pounds.

## 2016-02-27 ENCOUNTER — Other Ambulatory Visit: Payer: Self-pay | Admitting: Sports Medicine

## 2016-02-27 DIAGNOSIS — R634 Abnormal weight loss: Secondary | ICD-10-CM

## 2016-02-27 DIAGNOSIS — R635 Abnormal weight gain: Secondary | ICD-10-CM

## 2016-02-27 MED FILL — TRI-LO-SPRINTEC TABLET: 0.18/0.215/ | 28 days supply | Qty: 28 | Fill #0

## 2016-03-03 MED FILL — PHENTERMINE 37.5 MG TABLET: 37.5 | 30 days supply | Qty: 30 | Fill #0

## 2016-03-12 ENCOUNTER — Ambulatory Visit: Payer: 59 | Admitting: Sports Medicine

## 2016-03-24 MED FILL — TRI-LO-SPRINTEC TABLET: 0.18/0.215/ | 84 days supply | Qty: 84 | Fill #1

## 2016-06-16 MED FILL — TRI-LO-SPRINTEC TABLET: 0.18/0.215/ | 84 days supply | Qty: 84 | Fill #2

## 2016-09-09 MED FILL — TRI-LO-SPRINTEC TABLET: 0.18/0.215/ | 84 days supply | Qty: 84 | Fill #3

## 2016-12-01 MED FILL — TRI-LO-SPRINTEC TABLET: 0.18/0.215/ | 84 days supply | Qty: 84 | Fill #4

## 2016-12-29 ENCOUNTER — Telehealth: Payer: Self-pay | Admitting: Sports Medicine

## 2016-12-29 MED ORDER — AMOXICILLIN-POT CLAVULANATE 875-125 MG PO TABS: 1.0000 | ORAL_TABLET | Freq: Two times a day (BID) | ORAL | 0 refills | Status: AC

## 2016-12-29 MED ORDER — PREDNISONE 50 MG PO TABS: 50.0000 mg | ORAL_TABLET | Freq: Every day | ORAL | 0 refills | Status: DC

## 2016-12-29 NOTE — Telephone Encounter (Signed)
Patient husband calls, he has a sore throat, high centor score suggestive of streptococcal tonsillopharyngitis, wife has the same symptoms, adding Augmentin and prednisone. Follow up with PCP ASAP.

## 2017-03-02 MED FILL — TRI-LO-SPRINTEC TABLET: 0.18/0.215/ | 28 days supply | Qty: 28 | Fill #0

## 2017-04-01 MED FILL — TRI-LO-SPRINTEC TABLET: 0.18/0.215/ | 28 days supply | Qty: 28 | Fill #0

## 2017-04-27 MED FILL — TRI-LO-SPRINTEC TABLET: 0.18/0.215/ | 28 days supply | Qty: 28 | Fill #0

## 2017-05-24 ENCOUNTER — Telehealth: Payer: Self-pay | Admitting: Sports Medicine

## 2017-05-24 MED ORDER — OSELTAMIVIR PHOSPHATE 75 MG PO CAPS: 75.0000 mg | ORAL_CAPSULE | Freq: Every day | ORAL | 0 refills | Status: DC

## 2017-05-24 MED FILL — OSELTAMIVIR PHOSPHATE 75 MG: 75 | 14 days supply | Qty: 14 | Fill #0

## 2017-05-24 NOTE — Telephone Encounter (Signed)
Nohelia's husband has influenza, I am going to prophylax her.  75 mg daily for 14 days.

## 2017-05-26 MED FILL — TRI-LO-SPRINTEC TABLET: 0.18/0.215/ | 28 days supply | Qty: 28 | Fill #0

## 2017-05-31 DIAGNOSIS — Z6823 Body mass index (BMI) 23.0-23.9, adult: Secondary | ICD-10-CM | POA: Diagnosis not present

## 2017-05-31 DIAGNOSIS — Z01419 Encounter for gynecological examination (general) (routine) without abnormal findings: Secondary | ICD-10-CM | POA: Diagnosis not present

## 2017-05-31 LAB — HM PAP SMEAR: HM Pap smear: NEGATIVE

## 2017-06-24 MED FILL — TRI-LO-MARZIA TABLET: 0.18/0.215/ | 84 days supply | Qty: 84 | Fill #0

## 2017-09-16 MED FILL — TRI-LO-MARZIA TABLET: 0.18/0.215/ | 84 days supply | Qty: 84 | Fill #1

## 2017-11-30 MED FILL — TRI-LO-MARZIA TABLET: 0.18/0.215/ | 84 days supply | Qty: 84 | Fill #2

## 2018-01-24 ENCOUNTER — Encounter: Payer: Self-pay | Admitting: Sports Medicine

## 2018-01-24 ENCOUNTER — Ambulatory Visit (INDEPENDENT_AMBULATORY_CARE_PROVIDER_SITE_OTHER): Payer: 59

## 2018-01-24 ENCOUNTER — Ambulatory Visit (INDEPENDENT_AMBULATORY_CARE_PROVIDER_SITE_OTHER): Payer: 59 | Admitting: Sports Medicine

## 2018-01-24 DIAGNOSIS — R030 Elevated blood-pressure reading, without diagnosis of hypertension: Secondary | ICD-10-CM | POA: Diagnosis not present

## 2018-01-24 DIAGNOSIS — R05 Cough: Secondary | ICD-10-CM

## 2018-01-24 DIAGNOSIS — R059 Cough, unspecified: Secondary | ICD-10-CM

## 2018-01-24 DIAGNOSIS — Z0000 Encounter for general adult medical examination without abnormal findings: Secondary | ICD-10-CM

## 2018-01-24 DIAGNOSIS — R509 Fever, unspecified: Secondary | ICD-10-CM | POA: Insufficient documentation

## 2018-01-24 MED ORDER — PREDNISONE 50 MG PO TABS: 50.0000 mg | ORAL_TABLET | Freq: Every day | ORAL | 0 refills | Status: DC

## 2018-01-24 MED ORDER — AZITHROMYCIN 250 MG PO TABS
ORAL_TABLET | ORAL | 0 refills | Status: DC
Start: 2018-01-24 — End: 2018-02-01

## 2018-01-24 MED FILL — predniSONE 50 MG TABS: 50 | 5 days supply | Qty: 5 | Fill #0

## 2018-01-24 MED FILL — AZITHROMYCIN 250 MG TABLET: 250 | 5 days supply | Qty: 6 | Fill #0

## 2018-01-24 NOTE — Assessment & Plan Note (Signed)
With right lower lobe coarse lung sounds. Febrile. Azithromycin, prednisone, chest x-ray. Return as needed.

## 2018-01-24 NOTE — Assessment & Plan Note (Signed)
Without a diagnosis of hypertension, she is sick now, she will return in 2 weeks for a nurse visit recheck.

## 2018-01-24 NOTE — Progress Notes (Signed)
  Subjective:    CC: Feeling sick  HPI: For the past week this pleasant 37 year old female has had cough, earache, sore throat.  Low-grade fevers and chills.  No skin rash, no GI symptoms.  Symptoms are moderate, persistent.  Has a cough, nonproductive.  Elevated blood pressure: No headaches, visual changes, chest pain.  I reviewed the past medical history, family history, social history, surgical history, and allergies today and no changes were needed.  Please see the problem list section below in epic for further details.  Past Medical History: No past medical history on file. Past Surgical History: No past surgical history on file. Social History: Social History   Socioeconomic History  . Marital status: Married    Spouse name: Not on file  . Number of children: Not on file  . Years of education: Not on file  . Highest education level: Not on file  Occupational History  . Not on file  Social Needs  . Financial resource strain: Not on file  . Food insecurity:    Worry: Not on file    Inability: Not on file  . Transportation needs:    Medical: Not on file    Non-medical: Not on file  Tobacco Use  . Smoking status: Never Smoker  . Smokeless tobacco: Never Used  Substance and Sexual Activity  . Alcohol use: Never    Frequency: Never  . Drug use: Never  . Sexual activity: Yes    Birth control/protection: Pill  Lifestyle  . Physical activity:    Days per week: Not on file    Minutes per session: Not on file  . Stress: Not on file  Relationships  . Social connections:    Talks on phone: Not on file    Gets together: Not on file    Attends religious service: Not on file    Active member of club or organization: Not on file    Attends meetings of clubs or organizations: Not on file    Relationship status: Not on file  Other Topics Concern  . Not on file  Social History Narrative  . Not on file   Family History: No family history on file. Allergies: No Known  Allergies Medications: See med rec.  Review of Systems: No fevers, chills, night sweats, weight loss, chest pain, or shortness of breath.   Objective:    General: Well Developed, well nourished, and in no acute distress.  Neuro: Alert and oriented x3, extra-ocular muscles intact, sensation grossly intact.  HEENT: Normocephalic, atraumatic, pupils equal round reactive to light, neck supple, no masses, 1 to 2 cm tender cervical lymphadenopathy, thyroid nonpalpable.  Oropharynx, nasopharynx, ear canals unremarkable. Skin: Warm and dry, no rashes. Cardiac: Regular rate and rhythm, no murmurs rubs or gallops, no lower extremity edema.  Respiratory: Coarse sounds in the right lower lobe. Not using accessory muscles, speaking in full sentences.  Impression and Recommendations:    Coughing With right lower lobe coarse lung sounds. Febrile. Azithromycin, prednisone, chest x-ray. Return as needed.  Annual physical exam Adding routine labs.  Elevated blood pressure reading Without a diagnosis of hypertension, she is sick now, she will return in 2 weeks for a nurse visit recheck. ___________________________________________ Gwen Her. Dianah Field, M.D., ABFM., CAQSM. Primary Care and [place] Instructor of [place] [facility] of Medicine

## 2018-01-24 NOTE — Assessment & Plan Note (Signed)
Adding routine labs 

## 2018-02-01 ENCOUNTER — Encounter: Payer: Self-pay | Admitting: Sports Medicine

## 2018-02-01 ENCOUNTER — Ambulatory Visit (INDEPENDENT_AMBULATORY_CARE_PROVIDER_SITE_OTHER): Payer: 59 | Admitting: Sports Medicine

## 2018-02-01 DIAGNOSIS — R030 Elevated blood-pressure reading, without diagnosis of hypertension: Secondary | ICD-10-CM

## 2018-02-01 DIAGNOSIS — R509 Fever, unspecified: Secondary | ICD-10-CM

## 2018-02-01 NOTE — Progress Notes (Signed)
  Subjective:    CC: Still sick  HPI: Erasmo Downer returns, I saw her 8 days ago with cough, sore throat, she has now some conjunctival symptoms as well as muscle aches and body aches, cough is improving.  Continues to have low-grade fevers.  Mild headaches without neck stiffness, no chest pain, shortness of breath.  No GI symptoms, no skin rash.  I reviewed the past medical history, family history, social history, surgical history, and allergies today and no changes were needed.  Please see the problem list section below in epic for further details.  Past Medical History: No past medical history on file. Past Surgical History: No past surgical history on file. Social History: Social History   Socioeconomic History  . Marital status: Married    Spouse name: Not on file  . Number of children: Not on file  . Years of education: Not on file  . Highest education level: Not on file  Occupational History  . Not on file  Social Needs  . Financial resource strain: Not on file  . Food insecurity:    Worry: Not on file    Inability: Not on file  . Transportation needs:    Medical: Not on file    Non-medical: Not on file  Tobacco Use  . Smoking status: Never Smoker  . Smokeless tobacco: Never Used  Substance and Sexual Activity  . Alcohol use: Never    Frequency: Never  . Drug use: Never  . Sexual activity: Yes    Birth control/protection: Pill  Lifestyle  . Physical activity:    Days per week: Not on file    Minutes per session: Not on file  . Stress: Not on file  Relationships  . Social connections:    Talks on phone: Not on file    Gets together: Not on file    Attends religious service: Not on file    Active member of club or organization: Not on file    Attends meetings of clubs or organizations: Not on file    Relationship status: Not on file  Other Topics Concern  . Not on file  Social History Narrative  . Not on file   Family History: No family history on  file. Allergies: No Known Allergies Medications: See med rec.  Review of Systems: No fevers, chills, night sweats, weight loss, chest pain, or shortness of breath.   Objective:    General: Well Developed, well nourished, and in no acute distress.  Neuro: Alert and oriented x3, extra-ocular muscles intact, sensation grossly intact.  HEENT: Normocephalic, atraumatic, pupils equal round reactive to light, neck supple, no masses, no lymphadenopathy, thyroid nonpalpable.  Mild left-sided cervical lymphadenopathy, otherwise oropharynx, nasopharynx, ear canals unremarkable. Skin: Warm and dry, no rashes. Cardiac: Regular rate and rhythm, no murmurs rubs or gallops, no lower extremity edema.  Respiratory: Clear to auscultation bilaterally. Not using accessory muscles, speaking in full sentences.  Impression and Recommendations:    Fever Persistent now for 8 days, slightly improving, cough is improved but having more conjunctival symptoms. Exam is benign. Did not improve much with prednisone and azithromycin, chest x-ray 7 days ago did show hyperinflation consistent with bronchitis. We are going to test for flu, EBV, as well as CBC, CMP. ___________________________________________ Gwen Her. Dianah Field, M.D., ABFM., CAQSM. Primary Care and [place] Instructor of [place] of [facility] of Medicine

## 2018-02-01 NOTE — Patient Instructions (Signed)
Use ibuprofen 800 mg 3 times per day, keep hydrated, adequate rest.

## 2018-02-01 NOTE — Assessment & Plan Note (Signed)
Persistent now for 8 days, slightly improving, cough is improved but having more conjunctival symptoms. Exam is benign. Did not improve much with prednisone and azithromycin, chest x-ray 7 days ago did show hyperinflation consistent with bronchitis. We are going to test for flu, EBV, as well as CBC, CMP.

## 2018-02-02 ENCOUNTER — Encounter: Payer: Self-pay | Admitting: Sports Medicine

## 2018-02-02 ENCOUNTER — Ambulatory Visit: Payer: 59 | Admitting: Sports Medicine

## 2018-02-03 LAB — CBC WITH DIFFERENTIAL/PLATELET
Basophils Absolute: 122 cells/uL (ref 0–200)
Basophils Relative: 2 %
Eosinophils Absolute: 12 cells/uL — ABNORMAL LOW (ref 15–500)
Eosinophils Relative: 0.2 %
HCT: 37.7 % (ref 35.0–45.0)
Hemoglobin: 13.5 g/dL (ref 11.7–15.5)
Lymphs Abs: 3581 {cells}/uL (ref 850–3900)
MCH: 33.3 pg — ABNORMAL HIGH (ref 27.0–33.0)
MCHC: 35.8 g/dL (ref 32.0–36.0)
MCV: 92.9 fL (ref 80.0–100.0)
MPV: 9.6 fL (ref 7.5–12.5)
Monocytes Relative: 8.2 %
Neutro Abs: 1885 cells/uL (ref 1500–7800)
Neutrophils Relative %: 30.9 %
Platelets: 140 10*3/uL (ref 140–400)
RBC: 4.06 Million/uL (ref 3.80–5.10)
RDW: 11.7 % (ref 11.0–15.0)
Total Lymphocyte: 58.7 %
WBC mixed population: 500 {cells}/uL (ref 200–950)
WBC: 6.1 Thousand/uL (ref 3.8–10.8)

## 2018-02-03 LAB — COMPREHENSIVE METABOLIC PANEL
AG Ratio: 1.6 (calc) (ref 1.0–2.5)
Alkaline phosphatase (APISO): 44 U/L (ref 33–115)
BUN: 5 mg/dL — ABNORMAL LOW (ref 7–25)
Calcium: 9.6 mg/dL (ref 8.6–10.2)
Chloride: 104 mmol/L (ref 98–110)
Globulin: 2.7 g/dL (calc) (ref 1.9–3.7)
Glucose, Bld: 96 mg/dL (ref 65–139)
Potassium: 3.8 mmol/L (ref 3.5–5.3)
Total Protein: 7 g/dL (ref 6.1–8.1)

## 2018-02-03 LAB — HEMOGLOBIN A1C
Hgb A1c MFr Bld: 5.1 % of total Hgb (ref <5.7)
Mean Plasma Glucose: 100 (calc)
eAG (mmol/L): 5.5 (calc)

## 2018-02-03 LAB — CMV IGM: CMV IgM: 62 AU/mL — ABNORMAL HIGH

## 2018-02-03 LAB — COMPREHENSIVE METABOLIC PANEL WITH GFR
ALT: 67 U/L — ABNORMAL HIGH (ref 6–29)
AST: 50 U/L — ABNORMAL HIGH (ref 10–30)
Albumin: 4.3 g/dL (ref 3.6–5.1)
BUN/Creatinine Ratio: 7 (calc) (ref 6–22)
CO2: 24 mmol/L (ref 20–32)
Creat: 0.76 mg/dL (ref 0.50–1.10)
Sodium: 139 mmol/L (ref 135–146)
Total Bilirubin: 0.5 mg/dL (ref 0.2–1.2)

## 2018-02-03 LAB — LIPID PANEL W/REFLEX DIRECT LDL
Cholesterol: 186 mg/dL (ref <200)
HDL: 63 mg/dL (ref >50)
LDL Cholesterol (Calc): 92 mg/dL (calc)
Non-HDL Cholesterol (Calc): 123 mg/dL (ref <130)
Total CHOL/HDL Ratio: 3 (calc) (ref <5.0)
Triglycerides: 215 mg/dL — ABNORMAL HIGH (ref <150)

## 2018-02-03 LAB — INFLUENZA A & B ANTIBODIES
INFLUENZA TYPE A ANTIBODY SERUM: 1:64 {titer} — ABNORMAL HIGH
INFLUENZA TYPE B ANTIBODY SERUM: 1:8 {titer} — ABNORMAL HIGH

## 2018-02-03 LAB — EPSTEIN-BARR VIRUS VCA, IGM: EBV VCA IgM: <36 U/mL

## 2018-02-03 LAB — EPSTEIN-BARR VIRUS VCA, IGG: EBV VCA IgG: <18 U/mL

## 2018-02-03 LAB — TSH: TSH: 1.55 m[IU]/L

## 2018-02-07 ENCOUNTER — Ambulatory Visit (INDEPENDENT_AMBULATORY_CARE_PROVIDER_SITE_OTHER): Payer: 59 | Admitting: Sports Medicine

## 2018-02-07 ENCOUNTER — Encounter: Payer: Self-pay | Admitting: Sports Medicine

## 2018-02-07 DIAGNOSIS — R509 Fever, unspecified: Secondary | ICD-10-CM

## 2018-02-07 DIAGNOSIS — Z0000 Encounter for general adult medical examination without abnormal findings: Secondary | ICD-10-CM

## 2018-02-07 DIAGNOSIS — D069 Carcinoma in situ of cervix, unspecified: Secondary | ICD-10-CM

## 2018-02-07 NOTE — Assessment & Plan Note (Signed)
Ultimate diagnosis here is going to be cytomegalovirus mononucleosis. She did have a very high CMV IgM with normal EBV IgG and IgM titers. Also had an elevated influenza A titer.

## 2018-02-07 NOTE — Progress Notes (Signed)
Subjective:    CC: Physical  HPI:  Debbie Hayden is here for her physical, we diagnosed her with cytomegalovirus mononucleosis at the last visit, she is improving but slowly.  I did explain to her that symptoms can take 6 weeks to resolve.  Otherwise she has no complaints.  She has only a mild headache related to her mononucleosis, no fevers, no nausea, upper respiratory symptoms have resolved.  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  Managed by her OB/GYN.  I reviewed the past medical history, family history, social history, surgical history, and allergies today and no changes were needed.  Please see the problem list section below in epic for further details.  Past Medical History: No past medical history on file. Past Surgical History: No past surgical history on file. Social History: Social History   Socioeconomic History  . Marital status: Married    Spouse name: Not on file  . Number of children: Not on file  . Years of education: Not on file  . Highest education level: Not on file  Occupational History  . Not on file  Social Needs  . Financial resource strain: Not on file  . Food insecurity:    Worry: Not on file    Inability: Not on file  . Transportation needs:    Medical: Not on file    Non-medical: Not on file  Tobacco Use  . Smoking status: Never Smoker  . Smokeless tobacco: Never Used  Substance and Sexual Activity  . Alcohol use: Never    Frequency: Never  . Drug use: Never  . Sexual activity: Yes    Birth control/protection: Pill  Lifestyle  . Physical activity:    Days per week: Not on file    Minutes per session: Not on file  . Stress: Not on file  Relationships  . Social connections:    Talks on phone: Not on file    Gets together: Not on file    Attends religious service: Not on file    Active member of club or organization: Not on file    Attends meetings of clubs or  organizations: Not on file    Relationship status: Not on file  Other Topics Concern  . Not on file  Social History Narrative  . Not on file   Family History: No family history on file. Allergies: No Known Allergies Medications: See med rec.  Review of Systems: No headache, visual changes, nausea, vomiting, diarrhea, constipation, dizziness, abdominal pain, skin rash, fevers, chills, night sweats, swollen lymph nodes, weight loss, chest pain, body aches, joint swelling, muscle aches, shortness of breath, mood changes, visual or auditory hallucinations.  Objective:    General: Well Developed, well nourished, and in no acute distress.  Neuro: Alert and oriented x3, extra-ocular muscles intact, sensation grossly intact. Cranial nerves II through XII are intact, motor, sensory, and coordinative functions are all intact. HEENT: Normocephalic, atraumatic, pupils equal round reactive to light, neck supple, no masses, there is a single 1.5 cm soft, well-defined and movable left posterior cervical lymph node, thyroid nonpalpable. Oropharynx, nasopharynx, external ear canals are unremarkable. Skin: Warm and dry, no rashes noted.  Cardiac: Regular rate and rhythm, no murmurs rubs or gallops.  Respiratory: Clear to auscultation bilaterally. Not using accessory muscles, speaking in full sentences.  Abdominal: Soft, nontender, nondistended, positive bowel sounds, no masses, no organomegaly.  Musculoskeletal: Shoulder, elbow, wrist, hip, knee, ankle stable, and with full  range of motion.  Impression and Recommendations:    The patient was counselled, risk factors were discussed, anticipatory guidance given.  Annual physical exam Remarkable physical exam. 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  Fever Ultimate diagnosis here is going to be cytomegalovirus mononucleosis. She did  have a very high CMV IgM with normal EBV IgG and IgM titers. Also had an elevated influenza A titer.  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 ___________________________________________ Gwen Her. Dianah Field, M.D., ABFM., CAQSM. Primary Care and [place] Instructor of [place] of [facility] of Medicine

## 2018-02-07 NOTE — Assessment & Plan Note (Signed)
Status post conization with multiple normal Pap smears since.

## 2018-02-07 NOTE — Assessment & Plan Note (Signed)
Remarkable physical exam. 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

## 2018-02-15 ENCOUNTER — Encounter: Payer: Self-pay | Admitting: Sports Medicine

## 2018-02-17 ENCOUNTER — Encounter: Payer: Self-pay | Admitting: Sports Medicine

## 2018-03-03 MED FILL — TRI-LO-MARZIA TABLET: 0.18/0.215/ | 84 days supply | Qty: 84 | Fill #3

## 2018-05-26 MED FILL — TRI-LO-MARZIA TABLET: 0.18/0.215/ | 28 days supply | Qty: 28 | Fill #4

## 2018-06-21 MED FILL — TRI-LO-MARZIA TABLET: 0.18/0.215/ | 28 days supply | Qty: 28 | Fill #0

## 2018-07-12 DIAGNOSIS — Z6824 Body mass index (BMI) 24.0-24.9, adult: Secondary | ICD-10-CM | POA: Diagnosis not present

## 2018-07-12 DIAGNOSIS — D239 Other benign neoplasm of skin, unspecified: Secondary | ICD-10-CM | POA: Diagnosis not present

## 2018-07-12 DIAGNOSIS — Z01419 Encounter for gynecological examination (general) (routine) without abnormal findings: Secondary | ICD-10-CM | POA: Diagnosis not present

## 2018-07-12 DIAGNOSIS — N9089 Other specified noninflammatory disorders of vulva and perineum: Secondary | ICD-10-CM | POA: Diagnosis not present

## 2018-07-13 MED FILL — TRI-LO-SPRINTEC TABLET: 0.18/0.215/ | 84 days supply | Qty: 84 | Fill #0

## 2018-10-04 MED FILL — TRI-LO-SPRINTEC TABLET: 0.18/0.215/ | 84 days supply | Qty: 84 | Fill #1

## 2018-10-18 DIAGNOSIS — D239 Other benign neoplasm of skin, unspecified: Secondary | ICD-10-CM | POA: Diagnosis not present

## 2018-10-18 DIAGNOSIS — C44519 Basal cell carcinoma of skin of other part of trunk: Secondary | ICD-10-CM | POA: Diagnosis not present

## 2018-10-18 DIAGNOSIS — N9089 Other specified noninflammatory disorders of vulva and perineum: Secondary | ICD-10-CM | POA: Diagnosis not present

## 2018-10-18 DIAGNOSIS — N871 Moderate cervical dysplasia: Secondary | ICD-10-CM | POA: Diagnosis not present

## 2018-10-30 ENCOUNTER — Telehealth: Payer: Self-pay | Admitting: *Deleted

## 2018-10-30 NOTE — Telephone Encounter (Signed)
Called and spoke with the patient regarding her referral from Dr. Gaetano Net. Patient stated that she will call back to reschedule.

## 2018-11-01 ENCOUNTER — Telehealth: Payer: Self-pay | Admitting: *Deleted

## 2018-11-01 NOTE — Telephone Encounter (Signed)
Called and left the patient a message to call the office back.  

## 2018-11-03 ENCOUNTER — Telehealth: Payer: Self-pay | Admitting: *Deleted

## 2018-11-03 NOTE — Telephone Encounter (Signed)
Called and left the patient a message her to call the office back. Need to schedule her for a new patient appt.

## 2018-11-23 ENCOUNTER — Telehealth: Payer: Self-pay | Admitting: *Deleted

## 2018-11-23 NOTE — Telephone Encounter (Signed)
Called and spoke with Santiago Glad at Dr. Sherlynn Stalls office. They are aware that the patient never returned our calls. They have sent the patient a letter. Records sent to be uploaded in [place]

## 2018-11-27 ENCOUNTER — Telehealth: Payer: Self-pay | Admitting: *Deleted

## 2018-11-27 NOTE — Telephone Encounter (Signed)
Patient called and scheduled her referral, appt scheduled for 7/27

## 2018-12-11 ENCOUNTER — Other Ambulatory Visit: Payer: Self-pay

## 2018-12-11 ENCOUNTER — Encounter: Payer: Self-pay | Admitting: Gynecologic Oncology

## 2018-12-11 ENCOUNTER — Inpatient Hospital Stay: Payer: 59 | Attending: Gynecologic Oncology | Admitting: Gynecologic Oncology

## 2018-12-11 VITALS — BP 128/90 | HR 93 | Temp 97.8°F | Resp 17 | Ht 66.0 in | Wt 157.4 lb

## 2018-12-11 DIAGNOSIS — D071 Carcinoma in situ of vulva: Secondary | ICD-10-CM | POA: Diagnosis present

## 2018-12-11 NOTE — Progress Notes (Signed)
Consult Note: Gyn-Onc  Consult was requested by Dr. Gaetano Net for the evaluation of Debbie Hayden 38 y.o. female  CC:  Chief Complaint  Patient presents with  . 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  It is an apparently isolated lesion in the midline perineum.  I discussed with the patient options including medical therapy versus surgical excision.  I recommend the surgical excision for definitive pathology and ascertainment of margin status.  It is a small lesion I feel that there is a good chance that she will heal well from the surgery.  I explained that VIN 3 can be associated with recurrence and up to 30% of patients.  Therefore she require close surveillance postoperatively with an evaluation at 53-month and 12 months.  I explained wide local excision to the patient, its risks, its anticipated postoperative recovery course.  She is in agreement with proceeding in August.   HPI: Ms. Debbie Hayden is a 38 year old P1 who was seen in consultation at the request of Dr. Gertie Fey for VIN 3.  The patient was being seen for a routine gynecologic evaluation in June 2020.  Dr. 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  This prompted a biopsy which was performed on October 18, 2018.  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  She was referred to dermatology for further evaluation of this.  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  The patient has a remote history of adenocarcinoma in situ of the cervix in 2006.  This was excised with a LEEP procedure.  She has had normal Paps since that time including most recently in February 2020.  At that time she was negative for HPV and had normal cytology.  Patient is otherwise very healthy  woman.  She has had one prior vaginal delivery.  She takes only birth control pills for routine medications.  She is a non-smoker.  Current Meds:  Outpatient Encounter Medications as of 12/11/2018  Medication Sig  . Norgestimate-Ethinyl Estradiol Triphasic (ORTHO TRI-CYCLEN LO) 0.18/0.215/0.25 MG-25 MCG tab Take 1 tablet by mouth daily.   No facility-administered encounter medications on file as of 12/11/2018.     Allergy: No Known Allergies  Social Hx:   Social History   Socioeconomic History  . Marital status: Married    Spouse name: Not on file  . Number of children: Not on file  . Years of education: Not on file  . Highest education level: Not on file  Occupational History  . Not on file  Social Needs  . Financial resource strain: Not on file  . Food insecurity    Worry: Not on file    Inability: Not on file  . Transportation needs    Medical: Not on file    Non-medical: Not on file  Tobacco Use  . Smoking status: Never Smoker  . Smokeless tobacco: Never Used  Substance and Sexual Activity  . Alcohol use: Never    Frequency: Never  . Drug use: Never  . Sexual activity: Yes    Birth control/protection: Pill  Lifestyle  . Physical activity    Days per week: Not on file    Minutes per session: Not on file  . Stress: Not on file  Relationships  .  Social Herbalist on phone: Not on file    Gets together: Not on file    Attends religious service: Not on file    Active member of club or organization: Not on file    Attends meetings of clubs or organizations: Not on file    Relationship status: Not on file  . Intimate partner violence    Fear of current or ex partner: Not on file    Emotionally abused: Not on file    Physically abused: Not on file    Forced sexual activity: Not on file  Other Topics Concern  . Not on file  Social History Narrative  . Not on file    Past Surgical Hx:  Past Surgical History:  Procedure Laterality Date  . DILATION  AND CURETTAGE, DIAGNOSTIC / THERAPEUTIC  06/11/2011    Past Medical Hx: History reviewed. No pertinent past medical history.  Past Gynecological History:  Hx of AIS, SVD x1 No LMP recorded.  Family Hx:  Family History  Problem Relation Age of Onset  . Ovarian cancer Neg Hx   . Endometrial cancer Neg Hx   . Breast cancer Neg Hx   . Cervical cancer Neg Hx     Review of Systems:  Constitutional  Feels well,    ENT Normal appearing ears and nares bilaterally Skin/Breast  No rash, sores, jaundice, itching, dryness Cardiovascular  No chest pain, shortness of breath, or edema  Pulmonary  No cough or wheeze.  Gastro Intestinal  No nausea, vomitting, or diarrhoea. No bright red blood per rectum, no abdominal pain, change in bowel movement, or constipation.  Genito Urinary  No frequency, urgency, dysuria, no bleeding Musculo Skeletal  No myalgia, arthralgia, joint swelling or pain  Neurologic  No weakness, numbness, change in gait,  Psychology  No depression, anxiety, insomnia.   Vitals:  Blood pressure 128/90, pulse 93, temperature 97.8 F (36.6 C), temperature source Oral, resp. rate 17, height 5\' 6"  (1.676 m), weight 157 lb 6.4 oz (71.4 kg), SpO2 100 %.  Physical Exam: WD in NAD Neck  Supple NROM, without any enlargements.  Lymph Node Survey No cervical supraclavicular or inguinal adenopathy Cardiovascular  Pulse normal rate, regularity and rhythm. S1 and S2 normal.  Lungs  Clear to auscultation bilateraly, without wheezes/crackles/rhonchi. Good air movement.  Skin  No rash/lesions/breakdown  Psychiatry  Alert and oriented to person, place, and time  Abdomen  Normoactive bowel sounds, abdomen soft, non-tender and nonobese without evidence of hernia.  Back No CVA tenderness Genito Urinary  Vulva/vagina: Normal external female genitalia.  A 1.5 cm area of punctate leukoplakia is seen in the midline perineal body extending slightly to the patient's left.  It is  nontender.  The surrounding vulva is grossly normal.  Bladder/urethra:  No lesions or masses, well supported bladder  Vagina: normal, no lesions  Cervix: Normal appearing, no lesions.  Uterus:  Small, mobile, no parametrial involvement or nodularity.  Adnexa: no palpable masses. Rectal  deferred Extremities  No bilateral cyanosis, clubbing or edema.   Thereasa Solo, MD  12/11/2018, 11:29 AM

## 2018-12-11 NOTE — Patient Instructions (Addendum)
Plan to have a wide local excision of the vulva at the [facility] on December 21, 2018.  You will receive a phone call from the pre-surgical RN to discuss instructions.  Please call for any questions or concerns.  Vulvectomy, Care After This sheet gives you information about how to care for yourself after your procedure. Your health care provider may also give you more specific instructions. If you have problems or questions, contact your health care provider. What can I expect after the procedure? After the procedure, it is common to have:  Vaginal pain.  Vaginal numbness.  Vaginal swelling.  Bloody vaginal discharge. Follow these instructions at home: Activity   Rest as told by your health care provider.  Do not lift, push, or pull more than 5 lb (2.3 kg), or the limit that you are told, until your health care provider says that it is safe.  Avoid activities that take a lot of effort for as long as told by your health care provider. This includes any exercise.  Raise (elevate) your legs while sitting or lying down.  Avoid standing or sitting in one place for long periods of time.  Do not Tyriek Hofman your legs, especially when sitting, until your health care provider approves.  Return to your normal activities as told by your health care provider. Ask your health care provider what activities are safe for you. Bathing   Do not take baths, swim, or use a hot tub until your health care provider approves. Ask your health care provider if you can take showers. You may only be allowed to take sponge baths.  After passing urine or a bowel movement, wipe yourself from front to back and clean your vaginal area using a spray bottle.  If told by your health care provider, take a sitz bath to help with discomfort. This is a warm water bath you take while sitting down. ? Do this 3-4 times a day, or as often as told by your health care provider. ? The water should only  come up to your hips and cover your buttocks. ? You may pat the area dry with a soft, clean towel. ? If needed, you may then gently dry the area with a hair dryer on a cool setting for 5-10 minutes. An enclosed box fan may also be used to gently dry the area. Incision care   Follow instructions from your health care provider about how to take care of your incision areas. Make sure you: ? Wash your hands with soap and water before and after you change your bandages (dressing). If soap and water are not available, use hand sanitizer. ? Change your dressing as told by your health care provider. ? Leave stitches (sutures), skin glue, adhesive strips, or surgical clips in place. These skin closures may need to stay in place for 2 weeks or longer. If adhesive strip edges start to loosen and curl up, you may trim the loose edges. Do not remove adhesive strips completely unless your health care provider tells you to do that.  Check your incision areas every day for signs of infection. It may be helpful to use a handheld mirror to do this. Check for: ? Redness, swelling, or pain that has gotten worse. ? More fluid or blood. ? Warmth. ? Pus or a bad smell.  If you were sent home with a drain, take care of it as told by your health care provider. Lifestyle  Do not douche or  use tampons until your health care provider approves.  Do not have sex until your health care provider approves. Tell your health care provider if you have pain or numbness when you return to sexual activity.  Wear cotton underwear and comfortable, loose-fitting clothing. Medicines  Take over-the-counter and prescription medicines only as told by your health care provider.  Ask your health care provider if the medicine prescribed to you: ? Requires you to avoid driving or using heavy machinery. ? Can cause constipation. You may need to take these actions to prevent or treat constipation:  Take over-the-counter or  prescription medicines.  Eat foods that are high in fiber, such as beans, whole grains, and fresh fruits and vegetables.  Limit foods that are high in fat and processed sugars, such as fried or sweet foods. General instructions  Do not drive until your health care provider says that it is safe.  Drink enough fluid to keep your urine pale yellow.  Wear compression stockings as told by your health care provider. These stockings help to prevent blood clots and reduce swelling in your legs.  Keep all follow-up visits as told by your health care provider. This is important. Contact a health care provider if:  You have any of these problems in the incision area: ? More redness, swelling, or pain around the incision. ? More fluid or blood coming from the incision. ? Pus or a bad smell coming from the incision. ? The incision feels warm to the touch. ? The incision breaks open.  You have a fever.  You have painful or bloody urination.  You feel nauseous or you vomit.  You have diarrhea.  You develop constipation.  You develop a rash.  You feel dizzy or light-headed.  You have pain that does not get better with medicine. Get help right away if you:  Faint.  Have leg or chest pain.  Have abdominal pain.  Have shortness of breath. Summary  After the procedure, it is common to have some pain, numbness, or swelling in the vaginal area. It is also common to have some bloody vaginal discharge.  Do not lift, push, or pull more than 5 lb (2.3 kg), or the limit that you are told, or engage in activities that take a lot of effort until your health care provider approves.  Follow instructions from your health care provider about how to take care of your incision.  Keep all follow-up visits as told by your health care provider. This is important. This information is not intended to replace advice given to you by your health care provider. Make sure you discuss any questions you  have with your health care provider. Document Released: 12/16/2003 Document Revised: 05/30/2018 Document Reviewed: 05/31/2018 Elsevier Patient Education  2020 [employer].

## 2018-12-12 ENCOUNTER — Telehealth: Payer: Self-pay | Admitting: *Deleted

## 2018-12-12 ENCOUNTER — Telehealth: Payer: Self-pay | Admitting: Gynecologic Oncology

## 2018-12-12 NOTE — Telephone Encounter (Signed)
Patient called and wants to move her surgery to October. Message forwarded to [facility] APP

## 2018-12-15 NOTE — Telephone Encounter (Signed)
Returned call to patient.  She called the office stating she wanted to move her surgery out into October.  Left her a message stating we do not have our schedule for October at this time and that I would contact her when our schedule came out to reschedule her and to call our office for any questions or concerns.

## 2018-12-21 ENCOUNTER — Ambulatory Visit (HOSPITAL_BASED_OUTPATIENT_CLINIC_OR_DEPARTMENT_OTHER): Admit: 2018-12-21 | Payer: 59 | Admitting: Gynecologic Oncology

## 2018-12-21 ENCOUNTER — Encounter (HOSPITAL_BASED_OUTPATIENT_CLINIC_OR_DEPARTMENT_OTHER): Payer: Self-pay

## 2018-12-21 SURGERY — WIDE EXCISION VULVECTOMY
Anesthesia: General

## 2018-12-25 DIAGNOSIS — L812 Freckles: Secondary | ICD-10-CM | POA: Diagnosis not present

## 2018-12-25 DIAGNOSIS — C44519 Basal cell carcinoma of skin of other part of trunk: Secondary | ICD-10-CM | POA: Diagnosis not present

## 2018-12-25 DIAGNOSIS — D1801 Hemangioma of skin and subcutaneous tissue: Secondary | ICD-10-CM | POA: Diagnosis not present

## 2018-12-25 DIAGNOSIS — D225 Melanocytic nevi of trunk: Secondary | ICD-10-CM | POA: Diagnosis not present

## 2019-01-04 MED FILL — TRI-LO-SPRINTEC TABLET: 0.18/0.215/ | 84 days supply | Qty: 84 | Fill #2

## 2019-01-05 ENCOUNTER — Ambulatory Visit: Payer: 59 | Admitting: Gynecologic Oncology

## 2019-02-19 ENCOUNTER — Telehealth: Payer: Self-pay

## 2019-02-20 NOTE — Telephone Encounter (Signed)
LM to call back to Dr. Serita Grit office to discuss possible surgery dates in October. Dates to offer patient are 03-07-19 or 03-12-19.

## 2019-02-21 NOTE — Telephone Encounter (Signed)
LM in work vm to call back to Dr. Serita Grit office to discuss possible surgery dates in October. Dates to offer patient are 03-07-19 or 03-12-19.

## 2019-02-22 NOTE — Telephone Encounter (Signed)
LM requesting that she call the office to let us know if she is not intereed in surgery at this time.

## 2019-03-29 MED FILL — TRI-LO-SPRINTEC TABLET: 0.18/0.215/ | 84 days supply | Qty: 84 | Fill #3

## 2019-06-19 MED FILL — TRI-LO-SPRINTEC TABLET: 0.18/0.215/ | 28 days supply | Qty: 28 | Fill #4

## 2019-06-21 ENCOUNTER — Telehealth: Payer: Self-pay | Admitting: *Deleted

## 2019-06-21 NOTE — Telephone Encounter (Signed)
Patient called to schedule her surgery; explained that she would need a follow up before her surgery. Patient scheduled to see Dr Denman George on 2/23

## 2019-07-12 ENCOUNTER — Inpatient Hospital Stay: Payer: 59 | Admitting: Gynecologic Oncology

## 2019-07-18 MED FILL — TRI-LO-SPRINTEC TABLET: 0.18/0.215/ | 28 days supply | Qty: 28 | Fill #0

## 2019-07-24 ENCOUNTER — Inpatient Hospital Stay: Payer: 59 | Attending: Gynecologic Oncology | Admitting: Gynecologic Oncology

## 2019-07-24 ENCOUNTER — Other Ambulatory Visit: Payer: Self-pay

## 2019-07-24 ENCOUNTER — Encounter: Payer: Self-pay | Admitting: Gynecologic Oncology

## 2019-07-24 VITALS — BP 149/81 | HR 93 | Temp 99.1°F | Resp 18 | Ht 66.0 in | Wt 156.0 lb

## 2019-07-24 DIAGNOSIS — Z86001 Personal history of in-situ neoplasm of cervix uteri: Secondary | ICD-10-CM | POA: Insufficient documentation

## 2019-07-24 DIAGNOSIS — D071 Carcinoma in situ of vulva: Secondary | ICD-10-CM | POA: Diagnosis present

## 2019-07-24 MED ORDER — IBUPROFEN 800 MG PO TABS: 800.0000 mg | ORAL_TABLET | Freq: Three times a day (TID) | ORAL | 1 refills | Status: DC | PRN

## 2019-07-24 MED ORDER — SENNOSIDES-DOCUSATE SODIUM 8.6-50 MG PO TABS: 2.0000 | ORAL_TABLET | Freq: Every day | ORAL | 0 refills | Status: DC

## 2019-07-24 MED ORDER — OXYCODONE HCL 5 MG PO TABS: 5.0000 mg | ORAL_TABLET | ORAL | 0 refills | Status: DC | PRN

## 2019-07-24 MED FILL — oxyCODONE HCL 5 MG TABS: 5 | 5 days supply | Qty: 30 | Fill #0

## 2019-07-24 MED FILL — STOOL SOFTENER-LAXATIVE TAB: 50-8.6 | 50 days supply | Qty: 100 | Fill #0

## 2019-07-24 MED FILL — IBUPROFEN 800 MG TAB: 800 | 10 days supply | Qty: 30 | Fill #0

## 2019-07-24 NOTE — Progress Notes (Signed)
Follow-up Note: Gyn-Onc  Consult was initially requested by Dr. Gaetano Net for the evaluation of Debbie Hayden 39 y.o. female  CC:  Chief Complaint  Patient presents with  . VIN III (vulvar intraepithelial neoplasia III)    Assessment/Plan:  Ms. Debbie Hayden  is a 39 y.o.  year old with VIN 3.  It is an apparently isolated lesion in the midline perineum.  I discussed with the patient options including medical therapy versus surgical excision.  I recommend the surgical excision for definitive pathology and ascertainment of margin status.  It is a small lesion I feel that there is a good chance that she will heal well from the surgery.  I explained that VIN 3 can be associated with recurrence and up to 30% of patients.  Therefore she require close surveillance postoperatively with an evaluation at 68-month and 12 months.  I explained wide local excision to the patient, its risks, its anticipated postoperative recovery course.  She is in agreement with proceeding in April.  HPI: Ms. Debbie Hayden is a 39 year old P1 who was seen in consultation at the request of Dr. Gertie Fey for VIN 3.  The patient was being seen for a routine gynecologic evaluation in June 2020.  Dr. 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  This prompted a biopsy which was performed on October 18, 2018.  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  She was referred to dermatology for further evaluation of this.  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  The patient has a remote history of adenocarcinoma in situ of the cervix in 2006.  This was excised with a LEEP procedure.  She has had normal Paps since that time including most recently in February 2020.  At that time she was negative for HPV and had normal cytology.  Patient is otherwise very  healthy woman.  She has had one prior vaginal delivery.  She takes only birth control pills for routine medications.  She is a non-smoker.  Interval Hx:   She was scheduled for surgery in August 2020 but canceled surgery when she did not feel this would be amenable to her very busy job and the ability to take days off.  The lesion remains asymptomatic.  While it still remains difficult for her to take time off work she feels she needs to have this procedure done and therefore presents for reconsideration of the procedure that was planned which is a wide local excision of the perineum.  Current Meds:  Outpatient Encounter Medications as of 07/24/2019  Medication Sig  . Norgestimate-Ethinyl Estradiol Triphasic (ORTHO TRI-CYCLEN LO) 0.18/0.215/0.25 MG-25 MCG tab Take 1 tablet by mouth daily.   No facility-administered encounter medications on file as of 07/24/2019.    Allergy: No Known Allergies  Social Hx:   Social History   Socioeconomic History  . Marital status: Married    Spouse name: Not on file  . Number of children: Not on file  . Years of education: Not on file  . Highest education level: Not on file  Occupational History  . Not on file  Tobacco Use  . Smoking status: Never Smoker  . Smokeless tobacco: Never Used  Substance and Sexual Activity  . Alcohol use: Never  . Drug use: Never  . Sexual activity: Yes    Birth control/protection: Pill  Other Topics Concern  . Not on file  Social History Narrative  . Not on file   Social Determinants of Health   Financial Resource Strain:   . Difficulty of Paying Living Expenses: Not on file  Food Insecurity:   . Worried About Charity fundraiser in the Last Year: Not on file  . Ran Out of Food in the Last Year: Not on file  Transportation Needs:   . Lack of Transportation (Medical): Not on file  . Lack of Transportation (Non-Medical): Not on file  Physical Activity:   . Days of Exercise per Week: Not on file  . Minutes of  Exercise per Session: Not on file  Stress:   . Feeling of Stress : Not on file  Social Connections:   . Frequency of Communication with Friends and Family: Not on file  . Frequency of Social Gatherings with Friends and Family: Not on file  . Attends Religious Services: Not on file  . Active Member of Clubs or Organizations: Not on file  . Attends Archivist Meetings: Not on file  . Marital Status: Not on file  Intimate Partner Violence:   . Fear of Current or Ex-Partner: Not on file  . Emotionally Abused: Not on file  . Physically Abused: Not on file  . Sexually Abused: Not on file    Past Surgical Hx:  Past Surgical History:  Procedure Laterality Date  . DILATION AND CURETTAGE, DIAGNOSTIC / THERAPEUTIC  06/11/2011    Past Medical Hx: History reviewed. No pertinent past medical history.  Past Gynecological History:  Hx of AIS, SVD x1 No LMP recorded.  Family Hx:  Family History  Problem Relation Age of Onset  . Ovarian cancer Neg Hx   . Endometrial cancer Neg Hx   . Breast cancer Neg Hx   . Cervical cancer Neg Hx     Review of Systems:  Constitutional  Feels well,    ENT Normal appearing ears and nares bilaterally Skin/Breast  No rash, sores, jaundice, itching, dryness Cardiovascular  No chest pain, shortness of breath, or edema  Pulmonary  No cough or wheeze.  Gastro Intestinal  No nausea, vomitting, or diarrhoea. No bright red blood per rectum, no abdominal pain, change in bowel movement, or constipation.  Genito Urinary  No frequency, urgency, dysuria, no bleeding Musculo Skeletal  No myalgia, arthralgia, joint swelling or pain  Neurologic  No weakness, numbness, change in gait,  Psychology  No depression, anxiety, insomnia.   Vitals:  Blood pressure (!) 149/81, pulse 93, temperature 99.1 F (37.3 C), temperature source Temporal, resp. rate 18, height 5\' 6"  (1.676 m), weight 156 lb (70.8 kg), SpO2 100 %.  Physical Exam: WD in NAD Neck   Supple NROM, without any enlargements.  Lymph Node Survey No cervical supraclavicular or inguinal adenopathy Cardiovascular  Pulse normal rate, regularity and rhythm. S1 and S2 normal.  Lungs  Clear to auscultation bilateraly, without wheezes/crackles/rhonchi. Good air movement.  Skin  No rash/lesions/breakdown  Psychiatry  Alert and oriented to person, place, and time  Abdomen  Normoactive bowel sounds, abdomen soft, non-tender and nonobese without evidence of hernia.  Back No CVA tenderness Genito Urinary  Vulva/vagina: Normal external female genitalia.  A 1.5 cm area of punctate leukoplakia is seen in the midline perineal body extending slightly to the patient's left.  It is nontender.  The surrounding vulva is grossly normal.  Bladder/urethra:  No lesions or masses, well supported bladder  Vagina: normal, no lesions  Cervix: Normal appearing, no lesions.  Uterus:  Small, mobile, no parametrial involvement or nodularity.  Adnexa: no palpable masses. Rectal  deferred Extremities  No bilateral cyanosis, clubbing or edema.   Thereasa Solo, MD  07/24/2019, 4:06 PM

## 2019-07-24 NOTE — Patient Instructions (Addendum)
Preparing for your Surgery  Plan for surgery on September 06, 2019 with Dr. Everitt Amber at [facility]. You will be scheduled for a wide local excision of the vulva.   Pre-operative Testing -You will receive a phone call from presurgical testing at [facility] to discuss instructions over the phone and to arrange for COVID testing.  -Bring your insurance card, copy of an advanced directive if applicable, medication list  -You should not be taking blood thinners or aspirin at least ten days prior to surgery unless instructed by your surgeon.  -Do not take supplements such as fish oil (omega 3), red yeast rice, tumeric before your surgery.   Day Before Surgery at [place] will be advised to have no solid food after midnight and you can take in clear liquids up until 3 hours before the procedure.  Your role in recovery Your role is to become active as soon as directed by your doctor, while still giving yourself time to heal.  Rest when you feel tired. You will be asked to do the following in order to speed your recovery:  - Cough and breathe deeply. This helps to clear and expand your lungs and can prevent pneumonia after surgery.  - [place]. Do mild physical activity. Walking or moving your legs help your circulation and body functions return to normal. Do not try to get up or walk alone the first time after surgery.   -If you develop swelling on one leg or the other, pain in the back of your leg, redness/warmth in one of your legs, please call the office or go to the Emergency Room to have a doppler to rule out a blood clot. For shortness of breath, chest pain-seek care in the Emergency Room as soon as possible. - Actively manage your pain. Managing your pain lets you move in comfort. We will ask you to rate your pain on a scale of zero to 10. It is your responsibility to tell your doctor or nurse where and how much you hurt so your pain  can be treated.  Pain Management After Surgery -You have been prescribed your pain medication and bowel regimen medications before surgery so that you can have these available when you are discharged from the hospital. The pain medication is for use ONLY AFTER surgery and a new prescription will not be given.   -Make sure that you have Tylenol and Ibuprofen at home to use on a regular basis after surgery for pain control. We recommend alternating the medications every hour to six hours since they work differently and are processed in the body differently for pain relief.  -Review the attached handout on narcotic use and their risks and side effects.   Bowel Regimen -You have been prescribed Sennakot-S to take nightly to prevent constipation especially if you are taking the narcotic pain medication intermittently.  It is important to prevent constipation and drink adequate amounts of liquids. You can stop taking this medication when you are not taking pain medication and you are back on your normal bowel routine.  AFTER SURGERY INSTRUCTIONS  Return to work: 4-6 weeks if applicable  Activity: 1. Be up and out of the bed during the day.  Take a nap if needed.  You may walk up steps but be careful and use the hand rail.  Stair climbing will tire you more than you think, you may need to stop part way and rest.  2. No lifting or straining for 4 weeks over 10 pounds. No pushing, pulling, straining for 6 weeks.  3. No driving for 48 hours after surgery minimum.  Do not drive if you are taking narcotic pain medicine.   4. You can shower as soon as the next day after surgery. Shower daily.  Use soap and water on your incision and pat dry; don't rub.    5. No sexual activity and nothing in the vagina for 4 weeks.  6. You may experience a small amount of clear drainage from your incision, which is normal.  If the drainage persists, increases, or changes color please call the office.  7. Take  Tylenol or ibuprofen first for pain and only use narcotic pain medication for severe pain not relieved by the Tylenol or Ibuprofen.  Monitor your Tylenol intake to a max of 4,000 mg.  Diet: 1. Low sodium Heart Healthy Diet is recommended.  2. It is safe to use a laxative, such as Miralax or Colace, if you have difficulty moving your bowels. You can take Sennakot at bedtime every evening to keep bowel movements regular and to prevent constipation.    Wound Care: 1. Keep clean and dry.  Shower daily.  Reasons to call the Doctor:  Fever - Oral temperature greater than 100.4 degrees Fahrenheit  Foul-smelling vaginal discharge  Difficulty urinating  Nausea and vomiting  Increased pain at the site of the incision that is unrelieved with pain medicine.  Difficulty breathing with or without chest pain  New calf pain especially if only on one side  Sudden, continuing increased vaginal bleeding with or without clots.   Contacts: For questions or concerns you should contact:  Dr. Everitt Amber at [phone]  Joylene John, NP at [phone]  After Hours: call [phone] and have the GYN Oncologist paged/contacted   Vulvectomy  Vulvectomy is a surgical procedure to remove all or part of the outer female genital organs (vulva). The vulva includes the outer and inner lips of the vagina and the clitoris. You may need this surgery if you have a cancerous growth in your vulva. There are two types of vulvectomy:  A simple vulvectomy. This is the removal of the entire vulva.  A radical vulvectomy. A radical vulvectomy can be partial or complete. ? A partial radical vulvectomy is when part of the vulva and surrounding deep tissue is removed. ? A complete radical vulvectomy is when the vulva, clitoris, and surrounding deep tissue are removed. During a radical vulvectomy, some lymph nodes near the vulva may also be removed. Tell a health care provider about:  Any allergies you  have.  All medicines you are taking, including vitamins, herbs, eye drops, creams, and over-the-counter medicines.  Any problems you or family members have had with anesthetic medicines.  Any blood disorders you have.  Any surgeries you have had.  Any medical conditions you have.  Whether you are pregnant or may be pregnant. What are the risks? Generally, this is a safe procedure. However, problems may occur, including:  Infection.  Bleeding.  Allergic reactions to medicines.  Damage to other structures or organs.  Urinary tract infections.  Lymphedema. This is when your legs swell after the removal of lymph nodes from your groin area.  Pain or decreased sexual pleasure when having sex.  Long-term vaginal swelling, tightness, numbness, or pain.  A blood clot that may travel to the lung (pulmonary embolism). What happens before the procedure? Staying hydrated Follow instructions from your health  care provider about hydration Medicines Ask your health care provider about:  Changing or stopping your regular medicines. This is especially important if you are taking diabetes medicines or blood thinners.  Taking medicines such as aspirin and ibuprofen. These medicines can thin your blood. Do not take these medicines unless your health care provider tells you to take them.  Taking over-the-counter medicines, vitamins, herbs, and supplements. General instructions  Do not use any products that contain nicotine or tobacco for at least 4 weeks before the procedure. These products include cigarettes, e-cigarettes, and chewing tobacco. If you need help quitting, ask your health care provider.  Ask your health care provider: ? How your surgical site will be marked or identified. ? What steps will be taken to help prevent infection. These may include:  Removing hair at the surgery site.  Washing skin with a germ-killing soap.  Taking antibiotic medicine.  Plan to have  someone take you home from the hospital or clinic. What happens during the procedure?  An IV will be inserted into one of your veins.  You will be given one or more of the following: ? A medicine to help you relax (sedative). ? A medicine to make you fall asleep (general anesthetic).  A tube (catheter) may be inserted through the outer opening of your bladder (urethra) to drain urine during and after surgery.  Depending on the type of vulvectomy you are having, your surgeon will make an incision and remove the affected area. This may include: ? Removing the entire vulva. ? Removing part of the vulva, surrounding deep tissue, and lymph nodes. ? Removing the vulva, clitoris, surrounding deep tissue, and lymph nodes. I The procedure may vary among health care providers and hospitals. What happens after the procedure?  Your blood pressure, heart rate, breathing rate, and blood oxygen level will be monitored until you leave the hospital or clinic.  You will get medicine for pain as needed.  You may get medicine to prevent constipation.  You may be on a liquid diet at first, and then switch to a regular diet.  When you are taking fluids well, your IV will be removed.  If your catheter was left in place after surgery, it will be removed when your health care provider approves.  You will be asked to breathe deeply and to get out of bed and walk as soon as you can.  You may have to wear compression stockings. These stockings help to prevent blood clots and reduce swelling in your legs. Summary  Vulvectomy is a surgical procedure to remove all or part of the outer female genital organs (vulva).  Depending on the type of vulvectomy you are having, deep tissue and lymph nodes in the area may also be removed.  Follow instructions from your health care provider about taking medicines and about eating and drinking before the procedure.  After the procedure, you will be asked to breathe  deeply and to get out of bed and walk as soon as you can. This information is not intended to replace advice given to you by your health care provider. Make sure you discuss any questions you have with your health care provider. Document Revised: 05/30/2018 Document Reviewed: 05/31/2018 Elsevier Patient Education  2020 [employer].

## 2019-07-25 ENCOUNTER — Other Ambulatory Visit: Payer: Self-pay | Admitting: Gynecologic Oncology

## 2019-07-25 DIAGNOSIS — D071 Carcinoma in situ of vulva: Secondary | ICD-10-CM

## 2019-07-31 ENCOUNTER — Other Ambulatory Visit (HOSPITAL_BASED_OUTPATIENT_CLINIC_OR_DEPARTMENT_OTHER): Payer: Self-pay | Admitting: Obstetrics and Gynecology

## 2019-07-31 DIAGNOSIS — C530 Malignant neoplasm of endocervix: Secondary | ICD-10-CM | POA: Diagnosis not present

## 2019-07-31 DIAGNOSIS — Z01419 Encounter for gynecological examination (general) (routine) without abnormal findings: Secondary | ICD-10-CM | POA: Diagnosis not present

## 2019-07-31 DIAGNOSIS — Z6824 Body mass index (BMI) 24.0-24.9, adult: Secondary | ICD-10-CM | POA: Diagnosis not present

## 2019-08-02 MED FILL — IBUPROFEN 800 MG TAB: 800 | 10 days supply | Qty: 30 | Fill #0

## 2019-08-02 MED FILL — oxyCODONE HCL 5 MG TABS: 5 | 5 days supply | Qty: 30 | Fill #0

## 2019-08-02 MED FILL — STOOL SOFTENER-LAXATIVE TAB: 50-8.6 | 50 days supply | Qty: 100 | Fill #0

## 2019-08-17 MED FILL — TRI-LO-SPRINTEC TABLET: 0.18/0.215/ | 84 days supply | Qty: 84 | Fill #0

## 2019-08-29 ENCOUNTER — Encounter (HOSPITAL_BASED_OUTPATIENT_CLINIC_OR_DEPARTMENT_OTHER): Payer: Self-pay | Admitting: Gynecologic Oncology

## 2019-08-29 ENCOUNTER — Other Ambulatory Visit: Payer: Self-pay

## 2019-08-29 NOTE — Progress Notes (Signed)
Spoke w/ via phone for pre-op interview---patient Lab needs dos----    Urine preg          COVID test ------09-03-2019 1200 pm Arrive at -------815 am 09-06-2019 No food after midnight, clear liquids until 715 am then npo Medications to take morning of surgery -----none Diabetic medication -----n/a Patient Special Instructions -----none Pre-Op special Istructions -----none Patient verbalized understanding of instructions that were given at this phone interview. Patient denies shortness of breath, chest pain, fever, cough a this phone interview.

## 2019-09-03 ENCOUNTER — Other Ambulatory Visit (HOSPITAL_COMMUNITY)
Admission: RE | Admit: 2019-09-03 | Discharge: 2019-09-03 | Disposition: A | Discharge status: Home / Self Care | Payer: 59 | Source: Ambulatory Visit | Attending: Gynecologic Oncology | Admitting: Gynecologic Oncology

## 2019-09-03 DIAGNOSIS — Z20822 Contact with and (suspected) exposure to covid-19: Secondary | ICD-10-CM | POA: Insufficient documentation

## 2019-09-03 DIAGNOSIS — Z01812 Encounter for preprocedural laboratory examination: Secondary | ICD-10-CM | POA: Insufficient documentation

## 2019-09-03 LAB — SARS CORONAVIRUS 2 (TAT 6-24 HRS): SARS Coronavirus 2: NEGATIVE

## 2019-09-05 ENCOUNTER — Telehealth: Payer: Self-pay

## 2019-09-05 NOTE — Telephone Encounter (Signed)
Debbie Hayden states that she understands her pre op instructions and has no questions at this time. She will be preparing the bags of frozen peas this evening.

## 2019-09-06 ENCOUNTER — Ambulatory Visit (HOSPITAL_BASED_OUTPATIENT_CLINIC_OR_DEPARTMENT_OTHER): Payer: 59 | Admitting: Anesthesiology

## 2019-09-06 ENCOUNTER — Encounter (HOSPITAL_BASED_OUTPATIENT_CLINIC_OR_DEPARTMENT_OTHER)
Admission: RE | Disposition: A | Discharge status: Home / Self Care | Payer: Self-pay | Source: Home / Self Care | Attending: Gynecologic Oncology

## 2019-09-06 ENCOUNTER — Ambulatory Visit (HOSPITAL_BASED_OUTPATIENT_CLINIC_OR_DEPARTMENT_OTHER)
Admission: RE | Admit: 2019-09-06 | Discharge: 2019-09-06 | Disposition: A | Discharge status: Home / Self Care | Payer: 59 | Attending: Gynecologic Oncology | Admitting: Gynecologic Oncology

## 2019-09-06 ENCOUNTER — Other Ambulatory Visit: Payer: Self-pay

## 2019-09-06 ENCOUNTER — Encounter (HOSPITAL_BASED_OUTPATIENT_CLINIC_OR_DEPARTMENT_OTHER): Payer: Self-pay | Admitting: Gynecologic Oncology

## 2019-09-06 DIAGNOSIS — M1990 Unspecified osteoarthritis, unspecified site: Secondary | ICD-10-CM | POA: Diagnosis not present

## 2019-09-06 DIAGNOSIS — M2012 Hallux valgus (acquired), left foot: Secondary | ICD-10-CM | POA: Diagnosis not present

## 2019-09-06 DIAGNOSIS — K219 Gastro-esophageal reflux disease without esophagitis: Secondary | ICD-10-CM | POA: Insufficient documentation

## 2019-09-06 DIAGNOSIS — Z79899 Other long term (current) drug therapy: Secondary | ICD-10-CM | POA: Diagnosis not present

## 2019-09-06 DIAGNOSIS — Z8541 Personal history of malignant neoplasm of cervix uteri: Secondary | ICD-10-CM | POA: Insufficient documentation

## 2019-09-06 DIAGNOSIS — D071 Carcinoma in situ of vulva: Secondary | ICD-10-CM | POA: Diagnosis not present

## 2019-09-06 HISTORY — DX: Carcinoma in situ of vulva: D07.1

## 2019-09-06 HISTORY — PX: VULVECTOMY: SHX1086

## 2019-09-06 LAB — POCT PREGNANCY, URINE: Preg Test, Ur: NEGATIVE

## 2019-09-06 SURGERY — WIDE EXCISION VULVECTOMY
Anesthesia: General | Site: Vulva

## 2019-09-06 MED ORDER — GABAPENTIN 300 MG PO CAPS
ORAL_CAPSULE | ORAL | Status: AC
  Filled 2019-09-06: qty 1

## 2019-09-06 MED ORDER — SCOPOLAMINE 1 MG/3DAYS TD PT72
1.0000 | MEDICATED_PATCH | TRANSDERMAL | Status: DC
  Administered 2019-09-06: 1.5 mg via TRANSDERMAL

## 2019-09-06 MED ORDER — MIDAZOLAM HCL 2 MG/2ML IJ SOLN
INTRAMUSCULAR | Status: AC
  Filled 2019-09-06: qty 2

## 2019-09-06 MED ORDER — ACETIC ACID 5 % SOLN
Status: DC | PRN
  Administered 2019-09-06: 1 via TOPICAL

## 2019-09-06 MED ORDER — FENTANYL CITRATE (PF) 250 MCG/5ML IJ SOLN
INTRAMUSCULAR | Status: AC
  Filled 2019-09-06: qty 5

## 2019-09-06 MED ORDER — OXYCODONE HCL 5 MG/5ML PO SOLN: 5.0000 mg | Freq: Once | ORAL | Status: DC | PRN

## 2019-09-06 MED ORDER — CEFAZOLIN SODIUM-DEXTROSE 2-4 GM/100ML-% IV SOLN
INTRAVENOUS | Status: AC
  Filled 2019-09-06: qty 100

## 2019-09-06 MED ORDER — PROMETHAZINE HCL 25 MG/ML IJ SOLN: 6.2500 mg | INTRAMUSCULAR | Status: DC | PRN

## 2019-09-06 MED ORDER — CEFAZOLIN SODIUM-DEXTROSE 2-4 GM/100ML-% IV SOLN
2.0000 g | INTRAVENOUS | Status: AC
  Administered 2019-09-06: 2 g via INTRAVENOUS

## 2019-09-06 MED ORDER — CELECOXIB 200 MG PO CAPS
400.0000 mg | ORAL_CAPSULE | ORAL | Status: AC
  Administered 2019-09-06: 09:00:00 400 mg via ORAL

## 2019-09-06 MED ORDER — GABAPENTIN 300 MG PO CAPS
300.0000 mg | ORAL_CAPSULE | ORAL | Status: AC
  Administered 2019-09-06: 09:00:00 300 mg via ORAL

## 2019-09-06 MED ORDER — PROPOFOL 10 MG/ML IV BOLUS
INTRAVENOUS | Status: AC
  Filled 2019-09-06: qty 20

## 2019-09-06 MED ORDER — FENTANYL CITRATE (PF) 100 MCG/2ML IJ SOLN: 25.0000 ug | INTRAMUSCULAR | Status: DC | PRN

## 2019-09-06 MED ORDER — BUPIVACAINE LIPOSOME 1.3 % IJ SUSP
INTRAMUSCULAR | Status: DC | PRN
  Administered 2019-09-06: 20 mL

## 2019-09-06 MED ORDER — LIDOCAINE HCL 1 % IJ SOLN
INTRAMUSCULAR | Status: DC | PRN
  Administered 2019-09-06: 60 mg via INTRADERMAL

## 2019-09-06 MED ORDER — ONDANSETRON HCL 4 MG/2ML IJ SOLN
INTRAMUSCULAR | Status: DC | PRN
  Administered 2019-09-06: 4 mg via INTRAVENOUS

## 2019-09-06 MED ORDER — LACTATED RINGERS IV SOLN: INTRAVENOUS | Status: DC

## 2019-09-06 MED ORDER — OXYCODONE HCL 5 MG PO TABS: 5.0000 mg | ORAL_TABLET | Freq: Once | ORAL | Status: DC | PRN

## 2019-09-06 MED ORDER — MIDAZOLAM HCL 5 MG/5ML IJ SOLN
INTRAMUSCULAR | Status: DC | PRN
  Administered 2019-09-06: 2 mg via INTRAVENOUS

## 2019-09-06 MED ORDER — DEXAMETHASONE SODIUM PHOSPHATE 10 MG/ML IJ SOLN: 4.0000 mg | INTRAMUSCULAR | Status: DC

## 2019-09-06 MED ORDER — BUPIVACAINE HCL 0.25 % IJ SOLN
INTRAMUSCULAR | Status: DC | PRN
  Administered 2019-09-06: 10 mL

## 2019-09-06 MED ORDER — ACETAMINOPHEN 500 MG PO TABS
1000.0000 mg | ORAL_TABLET | ORAL | Status: AC
  Administered 2019-09-06: 1000 mg via ORAL

## 2019-09-06 MED ORDER — DEXAMETHASONE SODIUM PHOSPHATE 4 MG/ML IJ SOLN
INTRAMUSCULAR | Status: DC | PRN
  Administered 2019-09-06: 4 mg via INTRAVENOUS

## 2019-09-06 MED ORDER — ACETAMINOPHEN 500 MG PO TABS
ORAL_TABLET | ORAL | Status: AC
  Filled 2019-09-06: qty 2

## 2019-09-06 MED ORDER — ONDANSETRON HCL 4 MG/2ML IJ SOLN
INTRAMUSCULAR | Status: AC
  Filled 2019-09-06: qty 2

## 2019-09-06 MED ORDER — SCOPOLAMINE 1 MG/3DAYS TD PT72
MEDICATED_PATCH | TRANSDERMAL | Status: AC
  Filled 2019-09-06: qty 1

## 2019-09-06 MED ORDER — PROPOFOL 10 MG/ML IV BOLUS
INTRAVENOUS | Status: DC | PRN
  Administered 2019-09-06: 160 mg via INTRAVENOUS

## 2019-09-06 MED ORDER — FENTANYL CITRATE (PF) 100 MCG/2ML IJ SOLN
INTRAMUSCULAR | Status: DC | PRN
  Administered 2019-09-06: 50 ug via INTRAVENOUS
  Administered 2019-09-06 (×3): 25 ug via INTRAVENOUS
  Administered 2019-09-06: 50 ug via INTRAVENOUS
  Administered 2019-09-06: 25 ug via INTRAVENOUS

## 2019-09-06 MED ORDER — CELECOXIB 200 MG PO CAPS
ORAL_CAPSULE | ORAL | Status: AC
  Filled 2019-09-06: qty 2

## 2019-09-06 MED ORDER — LIDOCAINE 2% (20 MG/ML) 5 ML SYRINGE
INTRAMUSCULAR | Status: AC
  Filled 2019-09-06: qty 5

## 2019-09-06 SURGICAL SUPPLY — 26 items
BLADE CLIPPER SENSICLIP SURGIC (BLADE) IMPLANT
BLADE SURG 15 STRL LF DISP TIS (BLADE) ×1 IMPLANT
BLADE SURG 15 STRL SS (BLADE) ×2
CANISTER SUCT 3000ML PPV (MISCELLANEOUS) ×2 IMPLANT
CATH ROBINSON RED A/P 14FR (CATHETERS) ×2 IMPLANT
COVER WAND RF STERILE (DRAPES) ×2 IMPLANT
GAUZE 4X4 16PLY RFD (DISPOSABLE) ×2 IMPLANT
GLOVE BIO SURGEON STRL SZ 6 (GLOVE) ×4 IMPLANT
GOWN STRL REUS W/TWL LRG LVL3 (GOWN DISPOSABLE) ×4 IMPLANT
KIT TURNOVER CYSTO (KITS) ×2 IMPLANT
NDL HYPO 25X1 1.5 SAFETY (NEEDLE) ×1 IMPLANT
NEEDLE HYPO 25X1 1.5 SAFETY (NEEDLE) ×2 IMPLANT
NS IRRIG 500ML POUR BTL (IV SOLUTION) ×2 IMPLANT
PACK PERINEAL COLD (PAD) ×2 IMPLANT
PACK VAGINAL WOMENS (CUSTOM PROCEDURE TRAY) ×2 IMPLANT
SUT VIC AB 0 SH 27 (SUTURE) ×2 IMPLANT
SUT VIC AB 2-0 CT2 27 (SUTURE) IMPLANT
SUT VIC AB 2-0 SH 27 (SUTURE)
SUT VIC AB 2-0 SH 27XBRD (SUTURE) IMPLANT
SUT VIC AB 3-0 SH 27 (SUTURE) ×6
SUT VIC AB 3-0 SH 27X BRD (SUTURE) ×1 IMPLANT
SUT VICRYL 4-0 PS2 18IN ABS (SUTURE) ×4 IMPLANT
SWAB OB GYN 8IN STERILE 2PK (MISCELLANEOUS) IMPLANT
SYR BULB IRRIGATION 50ML (SYRINGE) ×2 IMPLANT
TOWEL OR 17X26 10 PK STRL BLUE (TOWEL DISPOSABLE) ×4 IMPLANT
WATER STERILE IRR 500ML POUR (IV SOLUTION) IMPLANT

## 2019-09-06 NOTE — H&P (Signed)
H&P Note: Gyn-Onc  Consult was initially requested by Dr. Gaetano Net for the evaluation of Debbie Hayden 39 y.o. 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  It is an apparently isolated lesion in the midline perineum.  I discussed with the patient options including medical therapy versus surgical excision.  I recommend the surgical excision for definitive pathology and ascertainment of margin status.  It is a small lesion I feel that there is a good chance that she will heal well from the surgery.  I explained that VIN 3 can be associated with recurrence and up to 30% of patients.  Therefore she require close surveillance postoperatively with an evaluation at 48-month and 12 months.  I explained wide local excision to the patient, its risks, its anticipated postoperative recovery course.  She is in agreement with proceeding today.  HPI: Debbie Hayden is a 39 year old P1 who was seen in consultation at the request of Dr. Gertie Fey for VIN 3.  The patient was being seen for a routine gynecologic evaluation in June 2020.  Dr. 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  This prompted a biopsy which was performed on October 18, 2018.  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  She was referred to dermatology for further evaluation of this.  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  The patient has a remote history of adenocarcinoma in situ of the cervix in 2006.  This was excised with a LEEP procedure.  She has had normal Paps since that time including most recently in February 2020.  At that time she was negative for HPV and had normal cytology.  Patient is otherwise very healthy woman.  She has had one prior vaginal delivery.  She takes only birth control pills for  routine medications.  She is a non-smoker.  Interval Hx:  She was scheduled for surgery in August 2020 but canceled surgery when she did not feel this would be amenable to her very busy job and the ability to take days off.  The lesion remains asymptomatic.  While it still remains difficult for her to take time off work she feels she needs to have this procedure done and therefore presents for reconsideration of the procedure that was planned which is a wide local excision of the perineum.  Current Meds:  Outpatient Encounter Medications as of 07/24/2019  Medication Sig  . Norgestimate-Ethinyl Estradiol Triphasic (ORTHO TRI-CYCLEN LO) 0.18/0.215/0.25 MG-25 MCG tab Take 1 tablet by mouth daily.   No facility-administered encounter medications on file as of 07/24/2019.    Allergy: No Known Allergies  Social Hx:   Social History   Socioeconomic History  . Marital status: Married    Spouse name: Not on file  . Number of children: Not on file  . Years of education: Not on file  . Highest education level: Not on file  Occupational History  . Not on file  Tobacco Use  . Smoking status: Never Smoker  . Smokeless tobacco: Never Used  Substance and Sexual Activity  . Alcohol use: Yes    Comment: occ  . Drug use: Never  . Sexual activity: Yes    Birth control/protection: Pill  Other Topics Concern  . Not on file  Social History  Narrative  . Not on file   Social Determinants of Health   Financial Resource Strain:   . Difficulty of Paying Living Expenses:   Food Insecurity:   . Worried About Charity fundraiser in the Last Year:   . Arboriculturist in the Last Year:   Transportation Needs:   . Film/video editor (Medical):   Marland Kitchen Lack of Transportation (Non-Medical):   Physical Activity:   . Days of Exercise per Week:   . Minutes of Exercise per Session:   Stress:   . Feeling of Stress :   Social Connections:   . Frequency of Communication with Friends and Family:   .  Frequency of Social Gatherings with Friends and Family:   . Attends Religious Services:   . Active Member of Clubs or Organizations:   . Attends Archivist Meetings:   Marland Kitchen Marital Status:   Intimate Partner Violence:   . Fear of Current or Ex-Partner:   . Emotionally Abused:   Marland Kitchen Physically Abused:   . Sexually Abused:     Past Surgical Hx:  Past Surgical History:  Procedure Laterality Date  . DILATION AND CURETTAGE, DIAGNOSTIC / THERAPEUTIC  06/11/2011  . LEEP  2016    Past Medical Hx:  Past Medical History:  Diagnosis Date  . Vulvar intraepithelial neoplasia (VIN) grade 3     Past Gynecological History:  Hx of AIS, SVD x1 Patient's last menstrual period was 08/09/2019.  Family Hx:  Family History  Problem Relation Age of Onset  . Ovarian cancer Neg Hx   . Endometrial cancer Neg Hx   . Breast cancer Neg Hx   . Cervical cancer Neg Hx     Review of Systems:  Constitutional  Feels well,    ENT Normal appearing ears and nares bilaterally Skin/Breast  No rash, sores, jaundice, itching, dryness Cardiovascular  No chest pain, shortness of breath, or edema  Pulmonary  No cough or wheeze.  Gastro Intestinal  No nausea, vomitting, or diarrhoea. No bright red blood per rectum, no abdominal pain, change in bowel movement, or constipation.  Genito Urinary  No frequency, urgency, dysuria, no bleeding Musculo Skeletal  No myalgia, arthralgia, joint swelling or pain  Neurologic  No weakness, numbness, change in gait,  Psychology  No depression, anxiety, insomnia.   Vitals:  Height 5\' 6"  (1.676 m), weight 152 lb (68.9 kg), last menstrual period 08/09/2019.  Physical Exam: WD in NAD Neck  Supple NROM, without any enlargements.  Lymph Node Survey No cervical supraclavicular or inguinal adenopathy Cardiovascular  Pulse normal rate, regularity and rhythm. S1 and S2 normal.  Lungs  Clear to auscultation bilateraly, without wheezes/crackles/rhonchi. Good air  movement.  Skin  No rash/lesions/breakdown  Psychiatry  Alert and oriented to person, place, and time  Abdomen  Normoactive bowel sounds, abdomen soft, non-tender and nonobese without evidence of hernia.  Back No CVA tenderness Genito Urinary  Vulva/vagina: Normal external female genitalia.  A 1.5 cm area of punctate leukoplakia is seen in the midline perineal body extending slightly to the patient's left.  It is nontender.  The surrounding vulva is grossly normal.  Bladder/urethra:  No lesions or masses, well supported bladder  Vagina: normal, no lesions  Cervix: Normal appearing, no lesions.  Uterus:  Small, mobile, no parametrial involvement or nodularity.  Adnexa: no palpable masses. Rectal  deferred Extremities  No bilateral cyanosis, clubbing or edema.   Thereasa Solo, MD  09/06/2019, 8:53 AM

## 2019-09-06 NOTE — Anesthesia Preprocedure Evaluation (Addendum)
Anesthesia Evaluation  Patient identified by MRN, date of birth, ID band Patient awake    Reviewed: Allergy & Precautions, NPO status , Patient's Chart, lab work & pertinent test results  History of Anesthesia Complications Negative for: history of anesthetic complications  Airway Mallampati: I  TM Distance: >3 FB Neck ROM: Full    Dental  (+) Dental Advisory Given, Teeth Intact   Pulmonary neg pulmonary ROS,    Pulmonary exam normal        Cardiovascular negative cardio ROS Normal cardiovascular exam     Neuro/Psych negative neurological ROS  negative psych ROS   GI/Hepatic Neg liver ROS, GERD (no meds)  Controlled,  Endo/Other  negative endocrine ROS  Renal/GU negative Renal ROS     Musculoskeletal  (+) Arthritis ,   Abdominal   Peds  Hematology negative hematology ROS (+)   Anesthesia Other Findings Covid neg 4/19   Reproductive/Obstetrics  VIN, AIS of cervix                            Anesthesia Physical Anesthesia Plan  ASA: II  Anesthesia Plan: General   Post-op Pain Management:    Induction: Intravenous  PONV Risk Score and Plan: 3 and Treatment may vary due to age or medical condition, Ondansetron, Midazolam and Dexamethasone  Airway Management Planned: LMA  Additional Equipment: None  Intra-op Plan:   Post-operative Plan: Extubation in OR  Informed Consent: I have reviewed the patients History and Physical, chart, labs and discussed the procedure including the risks, benefits and alternatives for the proposed anesthesia with the patient or authorized representative who has indicated his/her understanding and acceptance.     Dental advisory given  Plan Discussed with: CRNA and Anesthesiologist  Anesthesia Plan Comments:        Anesthesia Quick Evaluation

## 2019-09-06 NOTE — Op Note (Signed)
OPERATIVE NOTE  PATIENT: [name] DATE: 09/06/19  Preop Diagnosis: VIN III  Postoperative Diagnosis: same  Surgery: Partial simple posterior vulvectomy  Surgeons:  Donaciano Eva, MD Assistant: none  Anesthesia: General   Estimated blood loss: <98ml  IVF:  269ml   Urine output: 50 ml   Complications: None   Pathology: posterior vulva/perineal body with marking stitch at 12 o'clock  Operative findings: leukoplakia on posterior right labia minora extending across peroneal body to right perineal body. The lesion measured approximately 2cm  Procedure: The patient was identified in the preoperative holding area. Informed consent was signed on the chart. Patient was seen history was reviewed and exam was performed.   The patient was then taken to the operating room and placed in the supine position with SCD hose on. General anesthesia was then induced without difficulty. She was then placed in the dorsolithotomy position. The perineum was prepped with Betadine. The vagina was prepped with Betadine. The patient was then draped after the prep was dried. A Foley catheter was inserted into the bladder under sterile conditions.  Timeout was performed the patient, procedure, antibiotic, allergy, and length of procedure. 5% acetic acid solution was applied to the perineum. The vulvar tissues were inspected for areas of acetowhite changes or leukoplakia. The lesion was identified and the marking pen was used to circumscribe the area with appropriate surgical margins. The subcuticular tissues were infiltrated with 1% lidocaine. The 15 blade scalpel was used to make an incision through the skin circumferentially as marked. The skin elipse was grasped and was separated from the underlying deep dermal tissues with the bovie device. After the specimen had been completely resected, it was oriented and marked at 12 o'clock with a 0-vicryl suture. The bovie was used to obtain  hemostasis at the surgical bed. The subcutaneous tissues were irrigated and made hemostatic.   The deep dermal layer was approximated with 3-0vicryl mattress sutures to bring the skin edges into approximation and off tension. The wound was closed following langher's lines. The cutaneous layer was closed with interrupted 4-0 vicryl stitches and mattress sutures to ensure a tension free and hemostatic closure. The perineum was again irrigated. The foley was removed.  All instrument, suture, laparotomy, Ray-Tec, and needle counts were correct x2. The patient tolerated the procedure well and was taken recovery room in stable condition. This is Everitt Amber dictating an operative note on Kamdyn Ruddy.  Thereasa Solo, MD

## 2019-09-06 NOTE — Anesthesia Postprocedure Evaluation (Signed)
Anesthesia Post Note  Patient: Debbie Hayden  Procedure(s) Performed: WIDE EXCISION VULVECTOMY (N/A Vulva)     Patient location during evaluation: PACU Anesthesia Type: General Level of consciousness: awake and alert Pain management: pain level controlled Vital Signs Assessment: post-procedure vital signs reviewed and stable Respiratory status: spontaneous breathing, nonlabored ventilation and respiratory function stable Cardiovascular status: blood pressure returned to baseline and stable Postop Assessment: no apparent nausea or vomiting Anesthetic complications: no    Last Vitals:  Vitals:   09/06/19 1120 09/06/19 1157  BP:  (!) 160/91  Pulse: 71   Resp: 14 18  Temp: 36.8 C 36.8 C  SpO2: 100% 100%    Last Pain:  Vitals:   09/06/19 1157  TempSrc:   PainSc: 0-No pain                 Audry Pili

## 2019-09-06 NOTE — Anesthesia Procedure Notes (Signed)
Procedure Name: LMA Insertion Date/Time: 09/06/2019 9:58 AM Performed by: Garrel Ridgel, CRNA Pre-anesthesia Checklist: Patient identified, Emergency Drugs available, Suction available, Patient being monitored and Timeout performed Patient Re-evaluated:Patient Re-evaluated prior to induction Oxygen Delivery Method: Circle system utilized Preoxygenation: Pre-oxygenation with 100% oxygen Induction Type: IV induction Ventilation: Mask ventilation without difficulty LMA: LMA inserted LMA Size: 3.0 Number of attempts: 1 Placement Confirmation: positive ETCO2 and breath sounds checked- equal and bilateral Tube secured with: Tape Dental Injury: Teeth and Oropharynx as per pre-operative assessment

## 2019-09-06 NOTE — Transfer of Care (Signed)
Immediate Anesthesia Transfer of Care Note  Patient: Debbie Hayden  Procedure(s) Performed: WIDE EXCISION VULVECTOMY (N/A Vulva)  Patient Location: PACU  Anesthesia Type:General  Level of Consciousness: awake, alert , oriented and patient cooperative  Airway & Oxygen Therapy: Patient Spontanous Breathing and Patient connected to face mask oxygen  Post-op Assessment: Report given to RN and Post -op Vital signs reviewed and stable  Post vital signs: Reviewed and stable  Last Vitals:  Vitals Value Taken Time  BP 136/104 09/06/19 1046  Temp    Pulse 89 09/06/19 1047  Resp 9 09/06/19 1047  SpO2 100 % 09/06/19 1047  Vitals shown include unvalidated device data.  Last Pain:  Vitals:   09/06/19 0858  TempSrc:   PainSc: 0-No pain      Patients Stated Pain Goal: 5 ([id number])  Complications: No apparent anesthesia complications

## 2019-09-06 NOTE — Discharge Instructions (Addendum)
Information for Discharge Teaching: EXPAREL (bupivacaine liposome injectable suspension)   Your surgeon gave you EXPAREL(bupivacaine) in your surgical incision to help control your pain after surgery.   EXPAREL is a local anesthetic that provides pain relief by numbing the tissue around the surgical site.  EXPAREL is designed to release pain medication over time and can control pain for up to 72 hours.  Depending on how you respond to EXPAREL, you may require less pain medication during your recovery.  Possible side effects:  Temporary loss of sensation or ability to move in the area where bupivacaine was injected.  Nausea, vomiting, constipation  Rarely, numbness and tingling in your mouth or lips, lightheadedness, or anxiety may occur.  Call your doctor right away if you think you may be experiencing any of these sensations, or if you have other questions regarding possible side effects.  Follow all other discharge instructions given to you by your surgeon or nurse. Eat a healthy diet and drink plenty of water or other fluids.  If you return to the hospital for any reason within 96 hours following the administration of EXPAREL, please inform your health care providers. Post Anesthesia Home Care Instructions  Activity: Get plenty of rest for the remainder of the day. A responsible adult should stay with you for 24 hours following the procedure.  For the next 24 hours, DO NOT: -Drive a car -Paediatric nurse -Drink alcoholic beverages -Take any medication unless instructed by your physician -Make any legal decisions or sign important papers.  Meals: Start with liquid foods such as gelatin or soup. Progress to regular foods as tolerated. Avoid greasy, spicy, heavy foods. If nausea and/or vomiting occur, drink only clear liquids until the nausea and/or vomiting subsides. Call your physician if vomiting continues.  Special Instructions/Symptoms: Your throat may feel dry or sore  from the anesthesia or the breathing tube placed in your throat during surgery. If this causes discomfort, gargle with warm salt water. The discomfort should disappear within 24 hours.  If you had a scopolamine patch placed behind your ear for the management of post- operative nausea and/or vomiting:  1. The medication in the patch is effective for 72 hours, after which it should be removed.  Wrap patch in a tissue and discard in the trash. Wash hands thoroughly with soap and water. 2. You may remove the patch earlier than 72 hours if you experience unpleasant side effects which may include dry mouth, dizziness or visual disturbances. 3. Avoid touching the patch. Wash your hands with soap and water after contact with the patch.   Vulvectomy, Care After  The vulva is the external female genitalia, outside and around the vagina and pubic bone. It consists of:  The skin on, and in front of, the pubic bone.  The clitoris.  The labia majora (large lips) on the outside of the vagina.  The labia minora (small lips) around the opening of the vagina.  The opening and the skin in and around the vagina. A vulvectomy is the removal of the tissue of the vulva, which sometimes includes removal of the lymph nodes and tissue in the groin areas. These discharge instructions provide you with general information on caring for yourself after you leave the hospital. It is also important that you know the warning signs of complications, so that you can seek treatment. Please read the instructions outlined below and refer to this sheet in the next few weeks. Your caregiver may also give you specific information and medicines.  If you have any questions or complications after discharge, please call your caregiver. ACTIVITY  Rest as much as possible the first two weeks after discharge.  Arrange to have help from family or others with your daily activities when you go home.  Avoid heavy lifting (more than 5  pounds), pushing, or pulling.  If you feel tired, balance your activity with rest periods.  Follow your caregiver's instruction about climbing stairs and driving a car.  Increase activity gradually.  Do not exercise until you have permission from your caregiver. NUTRITION  You may resume your normal diet.  Drink 6 to 8 glasses of fluids a day.  Eat a healthy, balanced diet including portions of food from the meat (protein), milk, fruit, vegetable, and bread groups.  Your caregiver may recommend you take a multivitamin with iron. ELIMINATION  You may notice that your stream of urine is at a different angle, and may tend to spray. Using a plastic funnel may help to decrease urine spray.  If constipation occurs, drink more liquids, and add more fruits, vegetables, and bran to your diet. You may take a mild laxative, such as Milk of Magnesia, Metamucil, or a stool softener such as Colace, with permission from your caregiver. HYGIENE  You may shower and wash your hair.  Check with your caregiver about tub baths.  Do not add any bath oils or chemicals to your bath water, after you have permission to take baths.  While passing urine, pour water from a bottle or spray over your vulva to dilute the urine as it passes the incision (this will decrease burning and discomfort).  Clean yourself well after moving your bowels.  After urinating, do not wipe. Dap or pat dry with toilet paper or a dry cleath soft cloth.  A sitz bath will help keep your perineal area clean, reduce swelling, and provide comfort.  Avoid wearing underpants for the first 2 weeks and wear loose skirts to allow circulation of air around the incision  You do not need to apply dressings, salves or lotions to the wound.  The stitches are self-dissolving and will absorb and disappear over a couple of months (it is normal to notice the knot from the stitches on toilet paper after voiding).  HOME CARE INSTRUCTIONS    Apply a soft ice pack (or frozen bag of peas) to your perineum (vulva) every hour in the first 48 hours after surgery. This will reduce swelling.  Avoid activities that involve a lot of friction between your legs.  Avoid wearing pants or underpants in the 1st 2 weeks (skirts are preferable).  Take your temperature twice a day and record it, especially if you feel feverish or have chills.  Follow your caregiver's instructions about medicines, activity, and follow-up appointments after surgery.  Do not drink alcohol while taking pain medicine.  Change your dressing as advised by your caregiver.  You may take over-the-counter medicine for pain, recommended by your caregiver.  If your pain is not relieved with medicine, call your caregiver.  Do not take aspirin because it can cause bleeding.  Do not douche or use tampons (use a nonperfumed sanitary pad).  Do not have sexual intercourse until your caregiver gives you permission (typically 6 weeks postoperatively). Hugging, kissing, and playful sexual activity is fine with your caregiver's permission.  Warm sitz baths, with your caregiver's permission, are helpful to control swelling and discomfort.  Take showers instead of baths, until your caregiver gives you permission to take baths.  You may take a mild medicine for constipation, recommended by your caregiver. Bran foods and drinking a lot of fluids will help with constipation.  Make sure your family understands everything about your operation and recovery. SEEK MEDICAL CARE IF:   You notice swelling and redness around the wound area.  You notice a foul smell coming from the wound or on the surgical dressing.  You notice the wound is separating.  You have painful or bloody urination.  You develop nausea and vomiting.  You develop diarrhea.  You develop a rash.  You have a reaction or allergy from the medicine.  You feel dizzy or light-headed.  You need stronger  pain medicine. SEEK IMMEDIATE MEDICAL CARE IF:   You develop a temperature of 102 F (38.9 C) or higher.  You pass out.  You develop leg or chest pain.  You develop abdominal pain.  You develop shortness of breath.  You develop bleeding from the wound area.  You see pus in the wound area. MAKE SURE YOU:   Understand these instructions.  Will watch your condition.  Will get help right away if you are not doing well or get worse. Document Released: 12/16/2003 Document Revised: 09/17/2013 Document Reviewed: 04/04/2009 [facility] Patient Information 2015 [place], [place]. This information is not intended to replace advice given to you by your health care provider. Make sure you discuss any questions you have with your health care provider.

## 2019-09-07 ENCOUNTER — Telehealth: Payer: Self-pay

## 2019-09-07 NOTE — Telephone Encounter (Signed)
Ms Loyal states that she is eating, drinking, and urinating well. She has not passed gas. She has started the senokot-s 2 tabs last evening.  Recommended that she increase to 2 tabs bid.  If no BM by tomorrow afternoon, she should add Miralax 1 capful bid until BM. She is currently using the ibuprofen 800 mg  q 8 hrs and alt with Tylenol  With good effect. Incision D&I. Temp 99.0 She is aware of post op appointment on 10-01-19 and the office number [phone]  to call if she has any questions or concerns.

## 2019-09-10 LAB — SURGICAL PATHOLOGY

## 2019-09-11 ENCOUNTER — Telehealth: Payer: Self-pay | Admitting: *Deleted

## 2019-09-11 NOTE — Telephone Encounter (Signed)
Left a message for patient to call back to discuss pathology results

## 2019-09-11 NOTE — Telephone Encounter (Signed)
I spoke with Ms. Greany regarding her pathology report. I notified her that there is no evidence of cancer seen in the pathology report. She can f/u as scheduled w/ Dr. Denman George.

## 2019-10-01 ENCOUNTER — Inpatient Hospital Stay: Payer: 59 | Attending: Gynecologic Oncology | Admitting: Gynecologic Oncology

## 2019-10-01 ENCOUNTER — Other Ambulatory Visit: Payer: Self-pay

## 2019-10-01 ENCOUNTER — Encounter: Payer: Self-pay | Admitting: Gynecologic Oncology

## 2019-10-01 VITALS — BP 127/96 | HR 87 | Temp 98.3°F | Resp 16 | Ht 66.0 in | Wt 150.0 lb

## 2019-10-01 DIAGNOSIS — D071 Carcinoma in situ of vulva: Secondary | ICD-10-CM

## 2019-10-01 DIAGNOSIS — Z9889 Other specified postprocedural states: Secondary | ICD-10-CM | POA: Diagnosis not present

## 2019-10-01 NOTE — Progress Notes (Signed)
Follow-up Note: Gyn-Onc  Consult was initially requested by Dr. Gaetano Net for the evaluation of Debbie Hayden 39 y.o. female  CC:  Chief Complaint  Patient presents with  . VIN III (vulvar intraepithelial neoplasia III)    Follow up    Assessment/Plan:  Debbie Hayden  is a 39 y.o.  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 I counseled Debbie Hayden about the results of her biopsy. I discussed the risk for recurrence associated with VIN3. I will see her back in 6 months for surveillance. If she remains free of disease I will discharge her back to Dr Gaetano Net for ongoing annual surveillance.   HPI: Ms. Debbie Hayden is a 39 year old P1 who was seen in consultation at the request of Dr. Gaetano Net for VIN 3.  The patient was being seen for a routine gynecologic evaluation in June 2020.  Dr. 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  This prompted a biopsy which was performed on October 18, 2018.  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  She was referred to dermatology for further evaluation of this.  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  The patient has a remote history of adenocarcinoma in situ of the cervix in 2006.  This was excised with a LEEP procedure.  She has had normal Paps since that time including most recently in February 2020.  At that time she was negative for HPV and had normal cytology.  Patient is otherwise very healthy woman.  She has had one prior vaginal delivery.  She takes only birth control pills for routine medications.  She is a non-smoker.  She was scheduled for surgery in August 2020 but canceled surgery when she did not feel this would be amenable to her very busy job and the ability to take days off.    Interval Hx:  On 09/06/19 she underwent  wide local excision of the vulva (posterior). 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 Surgery was uncomplicated.  Final pathology revealed VIN 3 with negative margins and no invasive carcinoma.  Since surgery she has done well with no concerns.  Current Meds:  Outpatient Encounter Medications as of 10/01/2019  Medication Sig  . ibuprofen (ADVIL) 800 MG tablet Take 1 tablet (800 mg total) by mouth every 8 (eight) hours as needed for moderate pain. For AFTER surgery only  . Norgestimate-Ethinyl Estradiol Triphasic (ORTHO TRI-CYCLEN LO) 0.18/0.215/0.25 MG-25 MCG tab Take 1 tablet by mouth at bedtime.   . [DISCONTINUED] oxyCODONE (OXY IR/ROXICODONE) 5 MG immediate release tablet Take 1 tablet (5 mg total) by mouth every 4 (four) hours as needed for severe pain. For AFTER surgery only, do not take and drive (Patient not taking: Reported on 10/01/2019)  . [DISCONTINUED] senna-docusate (SENOKOT-S) 8.6-50 MG tablet Take 2 tablets by mouth at bedtime. For AFTER surgery, do not take if having diarrhea (Patient not taking: Reported on 10/01/2019)   No facility-administered encounter medications on file as of 10/01/2019.    Allergy: No Known Allergies  Social Hx:   Social History   Socioeconomic History  . Marital status: Married    Spouse name: Not on file  . Number of children: Not on file  . Years of education:  Not on file  . Highest education level: Not on file  Occupational History  . Not on file  Tobacco Use  . Smoking status: Never Smoker  . Smokeless tobacco: Never Used  Substance and Sexual Activity  . Alcohol use: Yes    Comment: occ  . Drug use: Never  . Sexual activity: Yes    Birth control/protection: Pill  Other Topics Concern  . Not on file  Social History Narrative  . Not on file   Social Determinants of Health   Financial Resource Strain:   . Difficulty of  Paying Living Expenses:   Food Insecurity:   . Worried About Charity fundraiser in the Last Year:   . Arboriculturist in the Last Year:   Transportation Needs:   . Film/video editor (Medical):   Marland Kitchen Lack of Transportation (Non-Medical):   Physical Activity:   . Days of Exercise per Week:   . Minutes of Exercise per Session:   Stress:   . Feeling of Stress :   Social Connections:   . Frequency of Communication with Friends and Family:   . Frequency of Social Gatherings with Friends and Family:   . Attends Religious Services:   . Active Member of Clubs or Organizations:   . Attends Archivist Meetings:   Marland Kitchen Marital Status:   Intimate Partner Violence:   . Fear of Current or Ex-Partner:   . Emotionally Abused:   Marland Kitchen Physically Abused:   . Sexually Abused:     Past Surgical Hx:  Past Surgical History:  Procedure Laterality Date  . DILATION AND CURETTAGE, DIAGNOSTIC / THERAPEUTIC  06/11/2011  . LEEP  2016  . VULVECTOMY N/A 09/06/2019   Procedure: WIDE EXCISION VULVECTOMY;  Surgeon: Everitt Amber, MD;  Location: [facility];  Service: Gynecology;  Laterality: N/A;    Past Medical Hx:  Past Medical History:  Diagnosis Date  . Vulvar intraepithelial neoplasia (VIN) grade 3     Past Gynecological History:  Hx of AIS, SVD x1 No LMP recorded.  Family Hx:  Family History  Problem Relation Age of Onset  . Ovarian cancer Neg Hx   . Endometrial cancer Neg Hx   . Breast cancer Neg Hx   . Cervical cancer Neg Hx     Review of Systems:  Constitutional  Feels well,    ENT Normal appearing ears and nares bilaterally Skin/Breast  No rash, sores, jaundice, itching, dryness Cardiovascular  No chest pain, shortness of breath, or edema  Pulmonary  No cough or wheeze.  Gastro Intestinal  No nausea, vomitting, or diarrhoea. No bright red blood per rectum, no abdominal pain, change in bowel movement, or constipation.  Genito Urinary  No frequency, urgency,  dysuria, no bleeding Musculo Skeletal  No myalgia, arthralgia, joint swelling or pain  Neurologic  No weakness, numbness, change in gait,  Psychology  No depression, anxiety, insomnia.   Vitals:  Blood pressure (!) 127/96, pulse 87, temperature 98.3 F (36.8 C), temperature source Temporal, resp. rate 16, height 5\' 6"  (1.676 m), weight 150 lb (68 kg), SpO2 100 %.  Physical Exam: WD in NAD Neck  Supple NROM, without any enlargements.  Lymph Node Survey No cervical supraclavicular or inguinal adenopathy Psychiatry  Alert and oriented to person, place, and time  Abdomen  Normoactive bowel sounds, abdomen soft, non-tender and nonobese without evidence of hernia.  Back No CVA tenderness Genito Urinary  Vulva/vagina: some wound separation in midline introitus, no  bleeding, no residual lesions.  Rectal  deferred Extremities  No bilateral cyanosis, clubbing or edema.   Thereasa Solo, MD  10/01/2019, 2:56 PM

## 2019-10-01 NOTE — Patient Instructions (Signed)
Avoid intercourse and swimming for another 2 weeks. It is safe to resume running and yoga.  Dr Denman George will see you back again in 6 months for a check up.

## 2019-10-30 MED FILL — TRI-LO-SPRINTEC TABLET: 0.18/0.215/ | 84 days supply | Qty: 84 | Fill #1

## 2020-01-29 MED FILL — TRI-LO-SPRINTEC TABLET: 0.18/0.215/ | 84 days supply | Qty: 84 | Fill #2

## 2020-03-18 ENCOUNTER — Inpatient Hospital Stay: Payer: 59 | Attending: Gynecologic Oncology | Admitting: Gynecologic Oncology

## 2020-03-18 ENCOUNTER — Other Ambulatory Visit: Payer: Self-pay

## 2020-03-18 ENCOUNTER — Encounter: Payer: Self-pay | Admitting: Gynecologic Oncology

## 2020-03-18 VITALS — BP 148/83 | HR 105 | Temp 98.0°F | Resp 18 | Wt 150.0 lb

## 2020-03-18 DIAGNOSIS — D071 Carcinoma in situ of vulva: Secondary | ICD-10-CM | POA: Diagnosis present

## 2020-03-18 NOTE — Progress Notes (Signed)
Follow-up Note: Gyn-Onc  Consult was initially requested by Dr. Gaetano Net for the evaluation of Debbie Hayden 39 y.o. female  CC:  Chief Complaint  Patient presents with  . VIN3    Assessment/Plan:  Debbie Hayden  is a 39 y.o.  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 No evidence of recurrence.  I will discharge her back to Dr Gaetano Net for ongoing annual surveillance.   HPI: Debbie Hayden is a 39 year old P1 who was seen in consultation at the request of Dr. Gaetano Net for VIN 3.  The patient was being seen for a routine gynecologic evaluation in June 2020.  Dr. 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  This prompted a biopsy which was performed on October 18, 2018.  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  She was referred to dermatology for further evaluation of this.  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  The patient has a remote history of adenocarcinoma in situ of the cervix in 2006.  This was excised with a LEEP procedure.  She has had normal Paps since that time including most recently in February 2020.  At that time she was negative for HPV and had normal cytology.  On 09/06/19 she underwent wide local excision of the vulva (posterior). 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 Surgery was uncomplicated.  Final pathology revealed VIN 3 with negative margins and no invasive carcinoma.  Interval Hx:   Since surgery she has done well with no concerns. She has no symptoms of recurrence.   Current Meds:  Outpatient Encounter Medications as of 03/18/2020  Medication Sig  . ibuprofen (ADVIL) 800 MG tablet Take 1 tablet (800 mg total) by mouth every 8  (eight) hours as needed for moderate pain. For AFTER surgery only  . Norgestimate-Ethinyl Estradiol Triphasic (ORTHO TRI-CYCLEN LO) 0.18/0.215/0.25 MG-25 MCG tab Take 1 tablet by mouth at bedtime.    No facility-administered encounter medications on file as of 03/18/2020.    Allergy: No Known Allergies  Social Hx:   Social History   Socioeconomic History  . Marital status: Married    Spouse name: Not on file  . Number of children: Not on file  . Years of education: Not on file  . Highest education level: Not on file  Occupational History  . Not on file  Tobacco Use  . Smoking status: Never Smoker  . Smokeless tobacco: Never Used  Vaping Use  . Vaping Use: Never used  Substance and Sexual Activity  . Alcohol use: Yes    Comment: occ  . Drug use: Never  . Sexual activity: Yes    Birth control/protection: Pill  Other Topics Concern  . Not on file  Social History Narrative  . Not on file   Social Determinants of Health   Financial Resource Strain:   . Difficulty of Paying Living Expenses: Not on file  Food Insecurity:   . Worried About Charity fundraiser in the Last Year: Not on file  . Ran Out of Food in the Last Year: Not on file  Transportation Needs:   . Lack of Transportation (Medical): Not on file  . Lack of Transportation (Non-Medical): Not on file  Physical Activity:   . Days of Exercise per Week: Not on file  . Minutes of Exercise per Session: Not on file  Stress:   . Feeling of Stress : Not on file  Social Connections:   . Frequency of Communication with Friends and Family: Not on file  . Frequency of Social Gatherings with Friends and Family: Not on file  . Attends Religious Services: Not on file  . Active Member of Clubs or Organizations: Not on file  . Attends Archivist Meetings: Not on file  . Marital Status: Not on file  Intimate Partner Violence:   . Fear of Current or Ex-Partner: Not on file  . Emotionally Abused: Not on file  .  Physically Abused: Not on file  . Sexually Abused: Not on file    Past Surgical Hx:  Past Surgical History:  Procedure Laterality Date  . DILATION AND CURETTAGE, DIAGNOSTIC / THERAPEUTIC  06/11/2011  . LEEP  2016  . VULVECTOMY N/A 09/06/2019   Procedure: WIDE EXCISION VULVECTOMY;  Surgeon: Everitt Amber, MD;  Location: [facility];  Service: Gynecology;  Laterality: N/A;    Past Medical Hx:  Past Medical History:  Diagnosis Date  . Vulvar intraepithelial neoplasia (VIN) grade 3     Past Gynecological History:  Hx of AIS, SVD x1 No LMP recorded.  Family Hx:  Family History  Problem Relation Age of Onset  . Ovarian cancer Neg Hx   . Endometrial cancer Neg Hx   . Breast cancer Neg Hx   . Cervical cancer Neg Hx     Review of Systems:  Constitutional  Feels well,    ENT Normal appearing ears and nares bilaterally Skin/Breast  No rash, sores, jaundice, itching, dryness Cardiovascular  No chest pain, shortness of breath, or edema  Pulmonary  No cough or wheeze.  Gastro Intestinal  No nausea, vomitting, or diarrhoea. No bright red blood per rectum, no abdominal pain, change in bowel movement, or constipation.  Genito Urinary  No frequency, urgency, dysuria, no bleeding Musculo Skeletal  No myalgia, arthralgia, joint swelling or pain  Neurologic  No weakness, numbness, change in gait,  Psychology  No depression, anxiety, insomnia.   Vitals:  Blood pressure (!) 148/83, pulse (!) 105, temperature 98 F (36.7 C), temperature source Tympanic, resp. rate 18, weight 150 lb (68 kg), SpO2 100 %.  Physical Exam: WD in NAD Neck  Supple NROM, without any enlargements.  Lymph Node Survey No cervical supraclavicular or inguinal adenopathy Psychiatry  Alert and oriented to person, place, and time  Abdomen  Normoactive bowel sounds, abdomen soft, non-tender and nonobese without evidence of hernia.  Back No CVA tenderness Genito Urinary  Vulva/vagina: no  lesions, acetic acid applied and vulva looked normal to inspection and palpation.  Rectal  deferred Extremities  No bilateral cyanosis, clubbing or edema.   Thereasa Solo, MD  03/18/2020, 1:52 PM

## 2020-03-18 NOTE — Patient Instructions (Signed)
Dr Denman George sees no signs of the pre-cancerous changes on the vulva.  Please inspect your vulva on a regular basis and let Dr Gaetano Net or Dr Denman George know if anything changes.  Please follow-up with Dr Gaetano Net for annual exams.

## 2020-04-25 MED FILL — TRI-LO-SPRINTEC TABLET: 0.18/0.215/ | 84 days supply | Qty: 84 | Fill #3

## 2020-07-17 MED FILL — TRI-LO-SPRINTEC TABLET: 0.18/0.215/ | 28 days supply | Qty: 28 | Fill #4

## 2020-08-12 ENCOUNTER — Other Ambulatory Visit (HOSPITAL_BASED_OUTPATIENT_CLINIC_OR_DEPARTMENT_OTHER): Payer: Self-pay | Admitting: Obstetrics and Gynecology

## 2020-08-12 MED FILL — TRI-LO-SPRINTEC TABLET: 0.18/0.215/ | 28 days supply | Qty: 28 | Fill #0

## 2020-09-09 ENCOUNTER — Other Ambulatory Visit (HOSPITAL_BASED_OUTPATIENT_CLINIC_OR_DEPARTMENT_OTHER): Payer: Self-pay

## 2020-09-09 MED FILL — Norgestimate-Eth Estrad Tab 0.18-25/0.215-25/0.25-25 MG-MCG: ORAL | 28 days supply | Qty: 28 | Fill #0 | Status: AC

## 2020-10-02 ENCOUNTER — Other Ambulatory Visit (HOSPITAL_BASED_OUTPATIENT_CLINIC_OR_DEPARTMENT_OTHER): Payer: Self-pay

## 2020-10-02 MED ORDER — NORGESTIM-ETH ESTRAD TRIPHASIC 0.18/0.215/0.25 MG-25 MCG PO TABS
1.0000 | ORAL_TABLET | Freq: Every day | ORAL | 3 refills | Status: AC
  Filled 2020-10-02: qty 28, 28d supply, fill #0
  Filled 2020-11-03: qty 28, 28d supply, fill #1
  Filled 2020-12-03: qty 28, 28d supply, fill #2
  Filled 2020-12-22: qty 28, 28d supply, fill #3

## 2020-10-03 ENCOUNTER — Other Ambulatory Visit (HOSPITAL_BASED_OUTPATIENT_CLINIC_OR_DEPARTMENT_OTHER): Payer: Self-pay

## 2020-10-06 ENCOUNTER — Other Ambulatory Visit: Payer: Self-pay | Admitting: Obstetrics and Gynecology

## 2020-10-06 DIAGNOSIS — R928 Other abnormal and inconclusive findings on diagnostic imaging of breast: Secondary | ICD-10-CM

## 2020-10-27 ENCOUNTER — Ambulatory Visit
Admission: RE | Admit: 2020-10-27 | Discharge: 2020-10-27 | Disposition: A | Discharge status: Home / Self Care | Payer: Managed Care, Other (non HMO) | Source: Ambulatory Visit | Attending: Obstetrics and Gynecology | Admitting: Obstetrics and Gynecology

## 2020-10-27 ENCOUNTER — Ambulatory Visit: Payer: 59

## 2020-10-27 ENCOUNTER — Other Ambulatory Visit: Payer: Self-pay

## 2020-10-27 DIAGNOSIS — R928 Other abnormal and inconclusive findings on diagnostic imaging of breast: Secondary | ICD-10-CM

## 2020-11-03 ENCOUNTER — Other Ambulatory Visit: Payer: Self-pay

## 2020-11-03 ENCOUNTER — Other Ambulatory Visit (HOSPITAL_BASED_OUTPATIENT_CLINIC_OR_DEPARTMENT_OTHER): Payer: Self-pay

## 2020-11-18 ENCOUNTER — Other Ambulatory Visit (HOSPITAL_BASED_OUTPATIENT_CLINIC_OR_DEPARTMENT_OTHER): Payer: Self-pay

## 2020-11-18 ENCOUNTER — Telehealth: Payer: Self-pay | Admitting: Sports Medicine

## 2020-11-18 DIAGNOSIS — U071 COVID-19: Secondary | ICD-10-CM | POA: Insufficient documentation

## 2020-11-18 MED ORDER — PAXLOVID 20 X 150 MG & 10 X 100MG PO TBPK
3.0000 | ORAL_TABLET | Freq: Two times a day (BID) | ORAL | 0 refills | Status: AC
  Filled 2020-11-18: qty 30, 5d supply, fill #0

## 2020-11-18 NOTE — Telephone Encounter (Signed)
Debbie Hayden, and their son Debbie Hayden were all diagnosed with COVID over the weekend, adding Paxlovid.

## 2020-11-18 NOTE — Assessment & Plan Note (Signed)
Debbie Hayden, and their son Max were all diagnosed with COVID over the weekend, adding Paxlovid.

## 2020-12-03 ENCOUNTER — Other Ambulatory Visit (HOSPITAL_BASED_OUTPATIENT_CLINIC_OR_DEPARTMENT_OTHER): Payer: Self-pay

## 2020-12-23 ENCOUNTER — Other Ambulatory Visit: Payer: Self-pay

## 2021-01-14 ENCOUNTER — Other Ambulatory Visit: Payer: Self-pay

## 2021-01-14 MED ORDER — NORGESTIM-ETH ESTRAD TRIPHASIC 0.18/0.215/0.25 MG-25 MCG PO TABS
ORAL_TABLET | Freq: Every day | ORAL | 2 refills | Status: AC
  Filled 2021-01-14 – 2021-01-29 (×2): qty 28, 28d supply, fill #0
  Filled 2021-02-25: qty 28, 28d supply, fill #1
  Filled 2021-03-25: qty 28, 28d supply, fill #2
  Filled 2021-04-24: qty 28, 28d supply, fill #3
  Filled 2021-05-22: qty 28, 28d supply, fill #0
  Filled 2021-06-17: qty 28, 28d supply, fill #1
  Filled 2021-07-13 – 2021-07-16 (×2): qty 28, 28d supply, fill #2
  Filled 2021-08-11: qty 28, 28d supply, fill #3
  Filled 2021-09-11: qty 28, 28d supply, fill #4

## 2021-01-21 ENCOUNTER — Other Ambulatory Visit: Payer: Self-pay

## 2021-01-29 ENCOUNTER — Other Ambulatory Visit (HOSPITAL_BASED_OUTPATIENT_CLINIC_OR_DEPARTMENT_OTHER): Payer: Self-pay

## 2021-01-29 ENCOUNTER — Other Ambulatory Visit: Payer: Self-pay

## 2021-01-30 ENCOUNTER — Other Ambulatory Visit (HOSPITAL_BASED_OUTPATIENT_CLINIC_OR_DEPARTMENT_OTHER): Payer: Self-pay

## 2021-02-25 ENCOUNTER — Other Ambulatory Visit (HOSPITAL_BASED_OUTPATIENT_CLINIC_OR_DEPARTMENT_OTHER): Payer: Self-pay

## 2021-02-25 ENCOUNTER — Other Ambulatory Visit: Payer: Self-pay

## 2021-03-25 ENCOUNTER — Other Ambulatory Visit (HOSPITAL_BASED_OUTPATIENT_CLINIC_OR_DEPARTMENT_OTHER): Payer: Self-pay

## 2021-03-25 ENCOUNTER — Other Ambulatory Visit: Payer: Self-pay

## 2021-03-25 MED ORDER — NORGESTIM-ETH ESTRAD TRIPHASIC 0.18/0.215/0.25 MG-25 MCG PO TABS
ORAL_TABLET | ORAL | 2 refills | Status: AC
  Filled 2021-03-25: qty 84, 84d supply, fill #0
  Filled 2021-10-07: qty 28, 28d supply, fill #0
  Filled 2021-11-03: qty 28, 28d supply, fill #1
  Filled 2021-12-04: qty 28, 28d supply, fill #2
  Filled 2021-12-28: qty 28, 28d supply, fill #3

## 2021-03-26 ENCOUNTER — Other Ambulatory Visit (HOSPITAL_BASED_OUTPATIENT_CLINIC_OR_DEPARTMENT_OTHER): Payer: Self-pay

## 2021-03-26 MED ORDER — NORGESTIM-ETH ESTRAD TRIPHASIC 0.18/0.215/0.25 MG-25 MCG PO TABS
1.0000 | ORAL_TABLET | Freq: Every day | ORAL | 1 refills | Status: DC
  Filled 2021-03-26: qty 84, 84d supply, fill #0
  Filled 2022-01-29: qty 28, 28d supply, fill #0
  Filled 2022-02-26: qty 28, 28d supply, fill #1
  Filled 2022-03-26: qty 28, 28d supply, fill #2

## 2021-04-24 ENCOUNTER — Other Ambulatory Visit: Payer: Self-pay

## 2021-04-24 ENCOUNTER — Other Ambulatory Visit (HOSPITAL_BASED_OUTPATIENT_CLINIC_OR_DEPARTMENT_OTHER): Payer: Self-pay

## 2021-05-06 ENCOUNTER — Telehealth: Payer: Managed Care, Other (non HMO) | Admitting: Physician Assistant

## 2021-05-06 ENCOUNTER — Other Ambulatory Visit (HOSPITAL_BASED_OUTPATIENT_CLINIC_OR_DEPARTMENT_OTHER): Payer: Self-pay

## 2021-05-06 DIAGNOSIS — J029 Acute pharyngitis, unspecified: Secondary | ICD-10-CM | POA: Diagnosis not present

## 2021-05-06 MED ORDER — AMOXICILLIN 500 MG PO CAPS
500.0000 mg | ORAL_CAPSULE | Freq: Two times a day (BID) | ORAL | 0 refills | Status: DC
  Filled 2021-05-06: qty 20, 10d supply, fill #0

## 2021-05-06 NOTE — Progress Notes (Signed)
Virtual Visit Consent   Debbie Hayden, you are scheduled for a virtual visit with a [facility] provider today.     Just as with appointments in the office, your consent must be obtained to participate.  Your consent will be active for this visit and any virtual visit you may have with one of our providers in the next 365 days.     If you have a MyChart account, a copy of this consent can be sent to you electronically.  All virtual visits are billed to your insurance company just like a traditional visit in the office.    As this is a virtual visit, video technology does not allow for your provider to perform a traditional examination.  This may limit your provider's ability to fully assess your condition.  If your provider identifies any concerns that need to be evaluated in person or the need to arrange testing (such as labs, EKG, etc.), we will make arrangements to do so.     Although advances in technology are sophisticated, we cannot ensure that it will always work on either your end or our end.  If the connection with a video visit is poor, the visit may have to be switched to a telephone visit.  With either a video or telephone visit, we are not always able to ensure that we have a secure connection.     I need to obtain your verbal consent now.   Are you willing to proceed with your visit today?    Debbie Hayden has provided verbal consent on 05/06/2021 for a virtual visit (video or telephone).   Leeanne Rio, [place]   Date: 05/06/2021 12:55 PM   Virtual Visit via Video Note   I, Leeanne Rio, connected with  Debbie Hayden  ([id number], [birth year]) on 05/06/21 at 12:45 PM EST by a video-enabled telemedicine application and verified that I am speaking with the correct person using two identifiers.  Location: Patient: Virtual Visit Location Patient: Home Provider: Virtual Visit Location Provider: Home Office   I discussed the limitations of evaluation and  management by telemedicine and the availability of in person appointments. The patient expressed understanding and agreed to proceed.    History of Present Illness: Debbie Hayden is a 40 y.o. who identifies as a female who was assigned female at birth, and is being seen today for severe sore throat starting yesterday. Notes low grade fever along with this < 101. Denies cough, congestion or other URI symptoms. Denies body aches or chills. Denies recent travel but was around several children this past weekend. Has taken a home COVID test which was negative.  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 For AFTER surgery only, Disp: 30 tablet, Rfl: 1   Norgestimate-Ethinyl Estradiol Triphasic (ORTHO TRI-CYCLEN LO) 0.18/0.215/0.25 MG-25 MCG tab, Take 1 tablet by mouth at bedtime. , Disp: , Rfl:    Norgestimate-Ethinyl Estradiol  Triphasic (TRI-LO-SPRINTEC) 0.18/0.215/0.25 MG-25 MCG tab, take 1 tab by mouth daily, Disp: 84 tablet, Rfl: 2   Norgestimate-Ethinyl Estradiol Triphasic 0.18/0.215/0.25 MG-25 MCG tab, TAKE 1 TABLET BY MOUTH ONCE DAILY, Disp: 28 tablet, Rfl: 12   Norgestimate-Ethinyl Estradiol Triphasic 0.18/0.215/0.25 MG-25 MCG tab, Take 1 tablet by mouth daily., Disp: 84 tablet, Rfl: 3   Norgestimate-Ethinyl Estradiol Triphasic 0.18/0.215/0.25 MG-25 MCG tab, Take 1 tablet by mouth once daily., Disp: 84 tablet, Rfl: 2   Norgestimate-Ethinyl Estradiol Triphasic  0.18/0.215/0.25 MG-25 MCG tab, TAKE 1 TABLET BY MOUTH ONCE DAILY, Disp: 84 tablet, Rfl: 1  Observations/Objective: Patient is well-developed, well-nourished in no acute distress.  Resting comfortably at home.  Head is normocephalic, atraumatic.  No labored breathing. Speech is clear and coherent with logical content.  Patient is alert and oriented at baseline.   Assessment and Plan: 1. Pharyngitis, unspecified etiology  Giving severity of sore throat and fever in absence of other symptoms, increased concern for bacterial pharyngitis. Discussed with her that since early on it can be harder to tell in the absence of testing as other symptoms can take a bit longer to develop. Will have her increase fluids and rest. Alternate Tylenol and Ibuprofen as needed for fever and sore throat. Recommend salt-water gargles and lozenges. Can place a humidifier in the bedroom. Will Give Rx Amox 500 mg BID in case of bacterial pharyngitis. She is to notify us of any new symptoms and repeat COVID testing if this occurs.   Follow Up Instructions: I discussed the assessment and treatment plan with the patient. The patient was provided an opportunity to ask questions and all were answered. The patient agreed with the plan and demonstrated an understanding of the instructions.  A copy of instructions were sent to the patient via MyChart unless otherwise noted below.   The patient was advised to call back or seek an in-person evaluation if the symptoms worsen or if the condition fails to improve as anticipated.  Time:  I spent 10 minutes with the patient via telehealth technology discussing the above problems/concerns.    Leeanne Rio, PA-C

## 2021-05-06 NOTE — Patient Instructions (Signed)
°  [name], thank you for joining Leeanne Rio, PA-C for today's virtual visit.  While this provider is not your primary care provider (PCP), if your PCP is located in our provider database this encounter information will be shared with them immediately following your visit.  Consent: (Patient) Debbie Hayden provided verbal consent for this virtual visit at the beginning of the encounter.  Current Medications:  Current Outpatient Medications:    ibuprofen (ADVIL) 800 MG tablet, Take 1 tablet (800 mg total) by mouth every 8 (eight) hours as needed for moderate pain. For AFTER surgery only, Disp: 30 tablet, Rfl: 1   Norgestimate-Ethinyl Estradiol Triphasic (ORTHO TRI-CYCLEN LO) 0.18/0.215/0.25 MG-25 MCG tab, Take 1 tablet by mouth at bedtime. , Disp: , Rfl:    Norgestimate-Ethinyl Estradiol Triphasic (TRI-LO-SPRINTEC) 0.18/0.215/0.25 MG-25 MCG tab, take 1 tab by mouth daily, Disp: 84 tablet, Rfl: 2   Norgestimate-Ethinyl Estradiol Triphasic 0.18/0.215/0.25 MG-25 MCG tab, TAKE 1 TABLET BY MOUTH ONCE DAILY, Disp: 28 tablet, Rfl: 12   Norgestimate-Ethinyl Estradiol Triphasic 0.18/0.215/0.25 MG-25 MCG tab, Take 1 tablet by mouth daily., Disp: 84 tablet, Rfl: 3   Norgestimate-Ethinyl Estradiol Triphasic 0.18/0.215/0.25 MG-25 MCG tab, Take 1 tablet by mouth once daily., Disp: 84 tablet, Rfl: 2   Norgestimate-Ethinyl Estradiol Triphasic 0.18/0.215/0.25 MG-25 MCG tab, TAKE 1 TABLET BY MOUTH ONCE DAILY, Disp: 84 tablet, Rfl: 1   Medications ordered in this encounter:  No orders of the defined types were placed in this encounter.    *If you need refills on other medications prior to your next appointment, please contact your pharmacy*  Follow-Up: Call back or seek an in-person evaluation if the symptoms worsen or if the condition fails to improve as anticipated.  Other Instructions   If you have been instructed to have an in-person evaluation today at a local Urgent Care  facility, please use the link below. It will take you to a list of all of our available [facility], including address, phone number and hours of operation. Please do not delay care.  [facility]  If you or a family member do not have a primary care provider, use the link below to schedule a visit and establish care. When you choose a [facility] primary care physician or advanced practice provider, you gain a long-term partner in health. Find a Primary Care Provider  Learn more about [facility]'s in-office and virtual care options: [facility]

## 2021-05-15 ENCOUNTER — Other Ambulatory Visit (HOSPITAL_COMMUNITY): Payer: Self-pay

## 2021-05-15 ENCOUNTER — Other Ambulatory Visit (HOSPITAL_BASED_OUTPATIENT_CLINIC_OR_DEPARTMENT_OTHER): Payer: Self-pay

## 2021-05-15 ENCOUNTER — Ambulatory Visit (INDEPENDENT_AMBULATORY_CARE_PROVIDER_SITE_OTHER): Payer: Managed Care, Other (non HMO)

## 2021-05-15 ENCOUNTER — Telehealth: Payer: Self-pay | Admitting: Sports Medicine

## 2021-05-15 ENCOUNTER — Other Ambulatory Visit: Payer: Self-pay

## 2021-05-15 DIAGNOSIS — R059 Cough, unspecified: Secondary | ICD-10-CM | POA: Insufficient documentation

## 2021-05-15 DIAGNOSIS — R051 Acute cough: Secondary | ICD-10-CM

## 2021-05-15 DIAGNOSIS — R053 Chronic cough: Secondary | ICD-10-CM | POA: Diagnosis not present

## 2021-05-15 MED ORDER — AZITHROMYCIN 250 MG PO TABS
ORAL_TABLET | ORAL | 0 refills | Status: DC
  Filled 2021-05-15: qty 6, 5d supply, fill #0

## 2021-05-15 MED ORDER — HYDROCOD POLST-CPM POLST ER 10-8 MG/5ML PO SUER
5.0000 mL | Freq: Two times a day (BID) | ORAL | 0 refills | Status: DC | PRN
  Filled 2021-05-15: qty 120, 12d supply, fill #0

## 2021-05-15 MED ORDER — PREDNISONE 50 MG PO TABS
50.0000 mg | ORAL_TABLET | Freq: Every day | ORAL | 0 refills | Status: AC
  Filled 2021-05-15: qty 5, 5d supply, fill #0

## 2021-05-15 NOTE — Assessment & Plan Note (Signed)
Persistent cough in spite of treatment with an outside provider with antibiotics. Unable to examine patient. Adding azithromycin, steroids, Tussionex.

## 2021-05-15 NOTE — Telephone Encounter (Signed)
Coughing Persistent cough in spite of treatment with an outside provider with antibiotics. Unable to examine patient. Adding azithromycin, steroids, Tussionex.

## 2021-05-22 ENCOUNTER — Other Ambulatory Visit (HOSPITAL_BASED_OUTPATIENT_CLINIC_OR_DEPARTMENT_OTHER): Payer: Self-pay

## 2021-06-02 ENCOUNTER — Encounter: Payer: Self-pay | Admitting: Sports Medicine

## 2021-06-02 ENCOUNTER — Ambulatory Visit (INDEPENDENT_AMBULATORY_CARE_PROVIDER_SITE_OTHER): Payer: Managed Care, Other (non HMO)

## 2021-06-02 ENCOUNTER — Ambulatory Visit (INDEPENDENT_AMBULATORY_CARE_PROVIDER_SITE_OTHER): Payer: Managed Care, Other (non HMO) | Admitting: Sports Medicine

## 2021-06-02 ENCOUNTER — Other Ambulatory Visit: Payer: Self-pay

## 2021-06-02 ENCOUNTER — Other Ambulatory Visit (HOSPITAL_BASED_OUTPATIENT_CLINIC_OR_DEPARTMENT_OTHER): Payer: Self-pay

## 2021-06-02 DIAGNOSIS — H9201 Otalgia, right ear: Secondary | ICD-10-CM

## 2021-06-02 DIAGNOSIS — R070 Pain in throat: Secondary | ICD-10-CM

## 2021-06-02 MED ORDER — HYDROCODONE BIT-HOMATROP MBR 5-1.5 MG/5ML PO SOLN
5.0000 mL | Freq: Three times a day (TID) | ORAL | 0 refills | Status: AC | PRN
  Filled 2021-06-02: qty 60, 4d supply, fill #0

## 2021-06-02 MED ORDER — IOHEXOL 300 MG/ML  SOLN
100.0000 mL | Freq: Once | INTRAMUSCULAR | Status: AC | PRN
  Administered 2021-06-02: 75 mL via INTRAVENOUS

## 2021-06-02 MED ORDER — PREDNISONE 10 MG PO TABS
ORAL_TABLET | Freq: Every day | ORAL | 0 refills | Status: DC
  Filled 2021-06-02: qty 42, 12d supply, fill #0

## 2021-06-02 NOTE — Assessment & Plan Note (Signed)
This is a pleasant 41 year old female, for over a month now she has had upper respiratory, maxillofacial symptoms including sore throat, pain in the right ear, cough. We have treated her already with Augmentin, azithromycin, 5-day burst of prednisone, Tussionex, secondary symptom is still cough which I think is postinfectious, we will block this with Hycodan syrup, but her primary complaint is right-sided ear pain, she localizes it over the mastoid process. Tells me this continues to worsen. On exam she does have some tenderness over the right mastoid process, her overall appearance is nontoxic. I did discuss imaging with the radiologist, we will proceed with a maxillofacial CT with contrast, this should include the majority of her temporal bone as well as her maxillofacial structures. We are looking for mastoiditis and/or deep-seated pharyngeal abscess.

## 2021-06-02 NOTE — Progress Notes (Signed)
° ° °  Procedures performed today:    None.  Independent interpretation of notes and tests performed by another provider:   None.  Brief History, Exam, Impression, and Recommendations:    Right ear pain This is a pleasant 41 year old female, for over a month now she has had upper respiratory, maxillofacial symptoms including sore throat, pain in the right ear, cough. We have treated her already with Augmentin, azithromycin, 5-day burst of prednisone, Tussionex, secondary symptom is still cough which I think is postinfectious, we will block this with Hycodan syrup, but her primary complaint is right-sided ear pain, she localizes it over the mastoid process. Tells me this continues to worsen. On exam she does have some tenderness over the right mastoid process, her overall appearance is nontoxic. I did discuss imaging with the radiologist, we will proceed with a maxillofacial CT with contrast, this should include the majority of her temporal bone as well as her maxillofacial structures. We are looking for mastoiditis and/or deep-seated pharyngeal abscess.    ___________________________________________ Gwen Her. Dianah Field, M.D., ABFM., CAQSM. Primary Care and [place] Instructor of [place] of [facility] of [employer]

## 2021-06-03 ENCOUNTER — Other Ambulatory Visit (HOSPITAL_BASED_OUTPATIENT_CLINIC_OR_DEPARTMENT_OTHER): Payer: Self-pay

## 2021-06-17 ENCOUNTER — Other Ambulatory Visit (HOSPITAL_BASED_OUTPATIENT_CLINIC_OR_DEPARTMENT_OTHER): Payer: Self-pay

## 2021-06-17 MED ORDER — COVID-19 AT HOME ANTIGEN TEST VI KIT
PACK | 0 refills | Status: AC
  Filled 2021-06-17: qty 2, 4d supply, fill #0

## 2021-06-18 ENCOUNTER — Ambulatory Visit (INDEPENDENT_AMBULATORY_CARE_PROVIDER_SITE_OTHER): Payer: Managed Care, Other (non HMO)

## 2021-06-18 ENCOUNTER — Ambulatory Visit (INDEPENDENT_AMBULATORY_CARE_PROVIDER_SITE_OTHER): Payer: Managed Care, Other (non HMO) | Admitting: Sports Medicine

## 2021-06-18 ENCOUNTER — Other Ambulatory Visit: Payer: Self-pay

## 2021-06-18 DIAGNOSIS — R1011 Right upper quadrant pain: Secondary | ICD-10-CM

## 2021-06-18 LAB — POCT URINALYSIS DIP (CLINITEK)
Bilirubin, UA: NEGATIVE
Glucose, UA: NEGATIVE mg/dL
Ketones, POC UA: NEGATIVE mg/dL
Leukocytes, UA: NEGATIVE
Nitrite, UA: NEGATIVE
POC PROTEIN,UA: NEGATIVE
Spec Grav, UA: >=1.03 — AB (ref 1.010–1.025)
Urobilinogen, UA: 0.2 E.U./dL
pH, UA: 6 (ref 5.0–8.0)

## 2021-06-18 LAB — TIQ- MISLABELED

## 2021-06-18 LAB — CBC WITH DIFFERENTIAL/PLATELET
Absolute Monocytes: 510 cells/uL (ref 200–950)
Basophils Absolute: 41 cells/uL (ref 0–200)
Basophils Relative: 0.5 %
Eosinophils Absolute: 41 cells/uL (ref 15–500)
Eosinophils Relative: 0.5 %
HCT: 40.8 % (ref 35.0–45.0)
Hemoglobin: 13.7 g/dL (ref 11.7–15.5)
Lymphs Abs: 2722 cells/uL (ref 850–3900)
MCH: 32.6 pg (ref 27.0–33.0)
MCHC: 33.6 g/dL (ref 32.0–36.0)
MCV: 97.1 fL (ref 80.0–100.0)
MPV: 9.8 fL (ref 7.5–12.5)
Monocytes Relative: 6.3 %
Neutro Abs: 4787 cells/uL (ref 1500–7800)
Neutrophils Relative %: 59.1 %
Platelets: 210 10*3/uL (ref 140–400)
RBC: 4.2 10*6/uL (ref 3.80–5.10)
RDW: 12.1 % (ref 11.0–15.0)
Total Lymphocyte: 33.6 %
WBC: 8.1 10*3/uL (ref 3.8–10.8)

## 2021-06-18 MED ORDER — IOHEXOL 300 MG/ML  SOLN
100.0000 mL | Freq: Once | INTRAMUSCULAR | Status: AC | PRN
  Administered 2021-06-18: 100 mL via INTRAVENOUS

## 2021-06-18 NOTE — Addendum Note (Signed)
Addended by: Dema Severin on: 06/18/2021 10:25 AM   Modules accepted: Orders

## 2021-06-18 NOTE — Progress Notes (Signed)
° ° °  Procedures performed today:    None.  Independent interpretation of notes and tests performed by another provider:   None.  Brief History, Exam, Impression, and Recommendations:    Acute abdominal pain in right upper quadrant This is a pleasant 41 year old female, she has had some viral symptoms for several weeks now. More recently she started to have severe pain right flank with radiation into the right upper quadrant, she is very tender over her right costal margin at the mid axillary line. Tenderness comes with guarding and rigidity. Because of her acute abdominal pain we will go ahead and get labs including CBC with differential, CMP, urinalysis, amylase, lipase.  Also considering her prolonged viral illness as well as her husband with similar symptoms and splenomegaly I will go ahead and do a Epstein-Barr virus IgG and IgM as well as CMV antibodies. CT of the abdomen and pelvis with oral and IV contrast. Declines Toradol IM or other pain medication but we can do this in the future if needed.    ___________________________________________ Gwen Her. Dianah Field, M.D., ABFM., CAQSM. Primary Care and [place] Instructor of [place] of [facility] of Medicine

## 2021-06-18 NOTE — Assessment & Plan Note (Addendum)
This is a pleasant 41 year old female, she has had some viral symptoms for several weeks now. More recently she started to have severe pain right flank with radiation into the right upper quadrant, she is very tender over her right costal margin at the mid axillary line. Tenderness comes with guarding and rigidity. Because of her acute abdominal pain we will go ahead and get labs including CBC with differential, CMP, urinalysis, amylase, lipase.  Also considering her prolonged viral illness as well as her husband with similar symptoms and splenomegaly I will go ahead and do a Epstein-Barr virus IgG and IgM as well as CMV antibodies. CT of the abdomen and pelvis with oral and IV contrast. Declines Toradol IM or other pain medication but we can do this in the future if needed.

## 2021-06-20 LAB — URINE CULTURE
MICRO NUMBER:: 12957762
SPECIMEN QUALITY:: ADEQUATE

## 2021-07-13 ENCOUNTER — Other Ambulatory Visit (HOSPITAL_BASED_OUTPATIENT_CLINIC_OR_DEPARTMENT_OTHER): Payer: Self-pay

## 2021-07-17 ENCOUNTER — Other Ambulatory Visit (HOSPITAL_BASED_OUTPATIENT_CLINIC_OR_DEPARTMENT_OTHER): Payer: Self-pay

## 2021-08-11 ENCOUNTER — Other Ambulatory Visit (HOSPITAL_BASED_OUTPATIENT_CLINIC_OR_DEPARTMENT_OTHER): Payer: Self-pay

## 2021-09-11 ENCOUNTER — Other Ambulatory Visit (HOSPITAL_BASED_OUTPATIENT_CLINIC_OR_DEPARTMENT_OTHER): Payer: Self-pay

## 2021-10-07 ENCOUNTER — Other Ambulatory Visit (HOSPITAL_BASED_OUTPATIENT_CLINIC_OR_DEPARTMENT_OTHER): Payer: Self-pay

## 2021-11-03 ENCOUNTER — Other Ambulatory Visit (HOSPITAL_BASED_OUTPATIENT_CLINIC_OR_DEPARTMENT_OTHER): Payer: Self-pay

## 2021-12-04 ENCOUNTER — Other Ambulatory Visit (HOSPITAL_BASED_OUTPATIENT_CLINIC_OR_DEPARTMENT_OTHER): Payer: Self-pay

## 2021-12-28 ENCOUNTER — Other Ambulatory Visit (HOSPITAL_BASED_OUTPATIENT_CLINIC_OR_DEPARTMENT_OTHER): Payer: Self-pay

## 2022-01-29 ENCOUNTER — Other Ambulatory Visit (HOSPITAL_BASED_OUTPATIENT_CLINIC_OR_DEPARTMENT_OTHER): Payer: Self-pay

## 2022-02-26 ENCOUNTER — Other Ambulatory Visit (HOSPITAL_BASED_OUTPATIENT_CLINIC_OR_DEPARTMENT_OTHER): Payer: Self-pay

## 2022-03-26 ENCOUNTER — Other Ambulatory Visit (HOSPITAL_BASED_OUTPATIENT_CLINIC_OR_DEPARTMENT_OTHER): Payer: Self-pay

## 2022-04-21 ENCOUNTER — Other Ambulatory Visit (HOSPITAL_BASED_OUTPATIENT_CLINIC_OR_DEPARTMENT_OTHER): Payer: Self-pay

## 2022-04-21 MED ORDER — NORGESTIM-ETH ESTRAD TRIPHASIC 0.18/0.215/0.25 MG-25 MCG PO TABS
1.0000 | ORAL_TABLET | Freq: Every day | ORAL | 3 refills | Status: DC
  Filled 2022-04-21: qty 28, 28d supply, fill #0
  Filled 2022-05-21: qty 28, 28d supply, fill #1
  Filled 2022-06-16: qty 28, 28d supply, fill #2
  Filled 2022-07-16: qty 28, 28d supply, fill #3
  Filled 2022-08-04: qty 28, 28d supply, fill #4
  Filled 2022-09-10: qty 28, 28d supply, fill #5
  Filled 2022-10-08: qty 28, 28d supply, fill #6
  Filled 2022-11-05: qty 28, 28d supply, fill #7
  Filled 2022-11-25: qty 28, 28d supply, fill #8
  Filled 2022-12-30: qty 28, 28d supply, fill #9
  Filled 2023-01-28: qty 28, 28d supply, fill #10
  Filled 2023-02-25 – 2023-02-26 (×2): qty 28, 28d supply, fill #11

## 2022-04-22 ENCOUNTER — Other Ambulatory Visit (HOSPITAL_BASED_OUTPATIENT_CLINIC_OR_DEPARTMENT_OTHER): Payer: Self-pay

## 2022-04-23 ENCOUNTER — Other Ambulatory Visit (HOSPITAL_BASED_OUTPATIENT_CLINIC_OR_DEPARTMENT_OTHER): Payer: Self-pay

## 2022-07-16 ENCOUNTER — Other Ambulatory Visit (HOSPITAL_BASED_OUTPATIENT_CLINIC_OR_DEPARTMENT_OTHER): Payer: Self-pay

## 2022-08-04 ENCOUNTER — Other Ambulatory Visit (HOSPITAL_BASED_OUTPATIENT_CLINIC_OR_DEPARTMENT_OTHER): Payer: Self-pay

## 2022-09-13 IMAGING — CT CT ABD-PELV W/ CM
2 of 5 series · 15 of 46 positions shown, 17 images · IV contrast (agent unspecified)
Comparison: None.

CLINICAL DATA: Abdominal pain

EXAM:
CT ABDOMEN AND PELVIS WITH CONTRAST
TECHNIQUE: Multidetector CT imaging of the abdomen and pelvis was performed
using the standard protocol following bolus administration of
intravenous contrast.

[Series 2: axial st · axial · 0.71mm/px · z∈[-504,-74]mm · 12 of 98 slices shown, 14 images]
[im 6/98  soft-tissue]
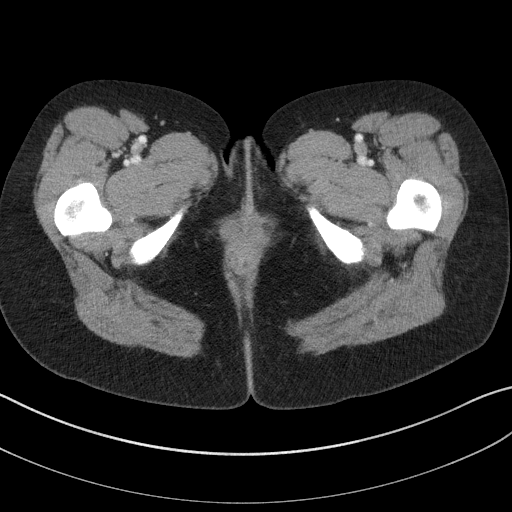
[im 6/98  bone]
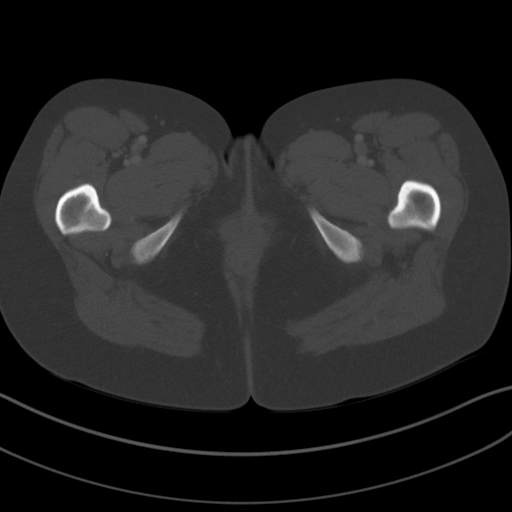
[im 16/98  soft-tissue]
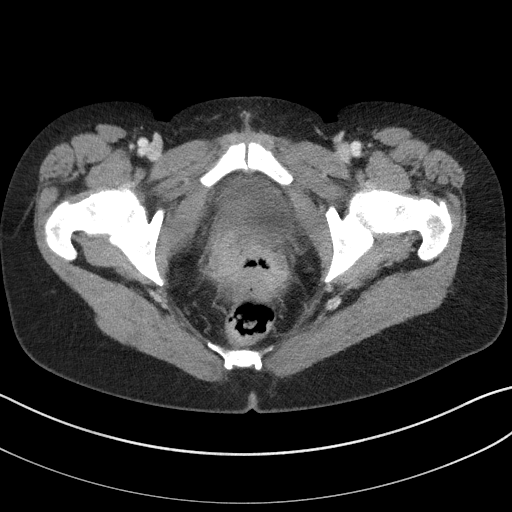
[im 21/98  soft-tissue]
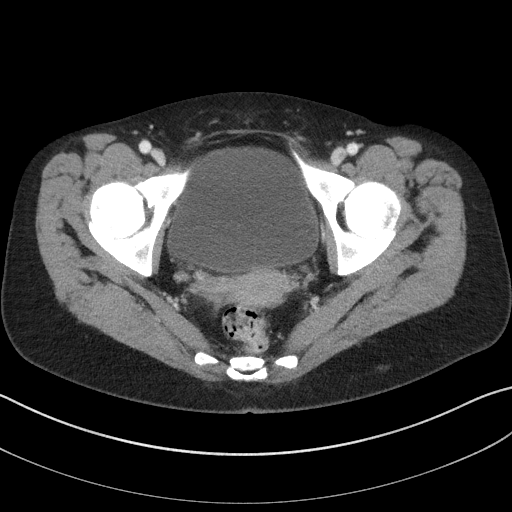
[im 31/98  soft-tissue]
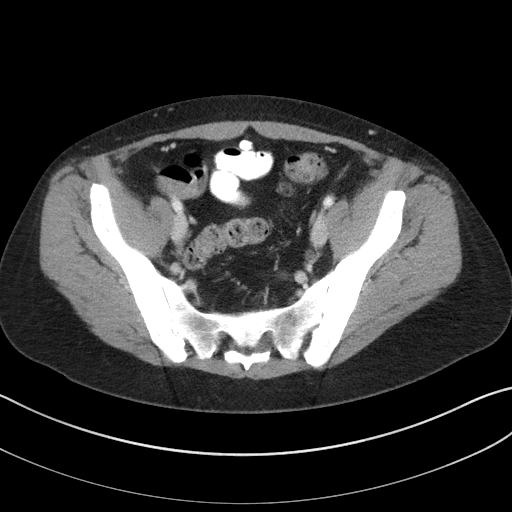
[im 36/98  soft-tissue]
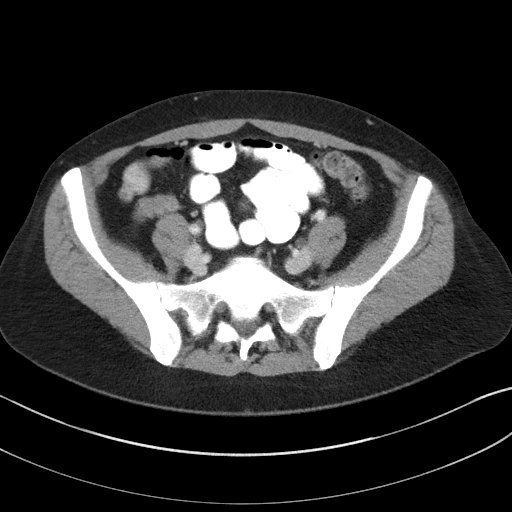
[im 46/98  soft-tissue]
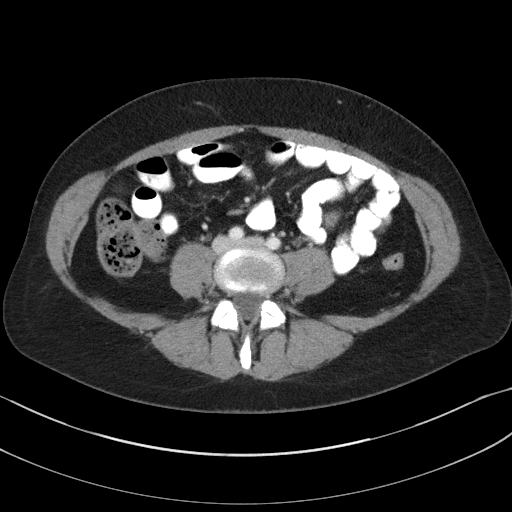
[im 52/98  soft-tissue]
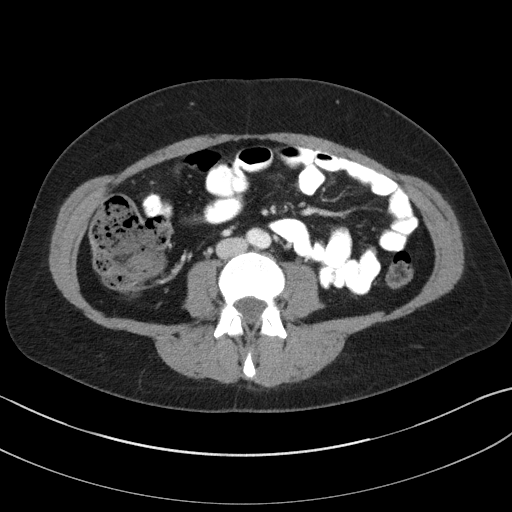
[im 62/98  soft-tissue]
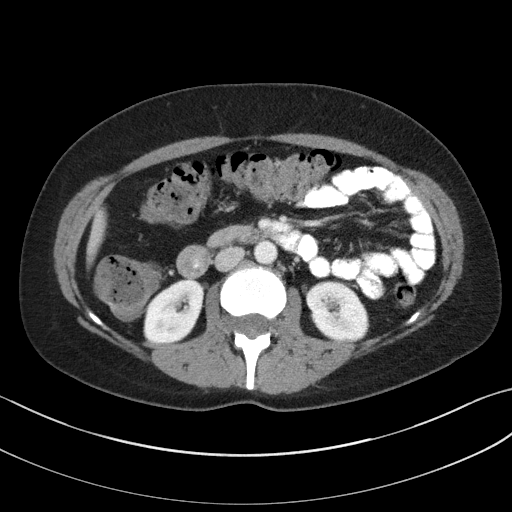
[im 67/98  soft-tissue]
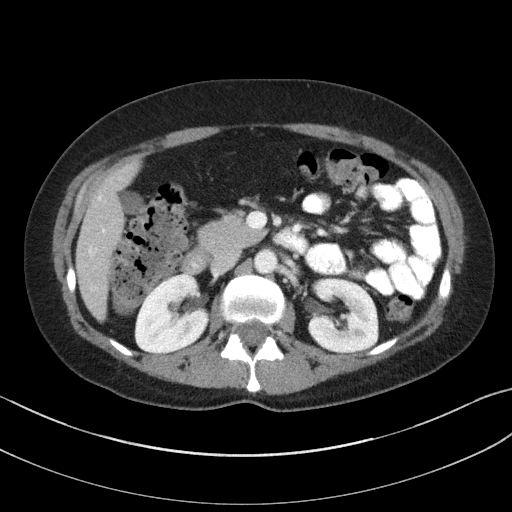
[im 67/98  bone]
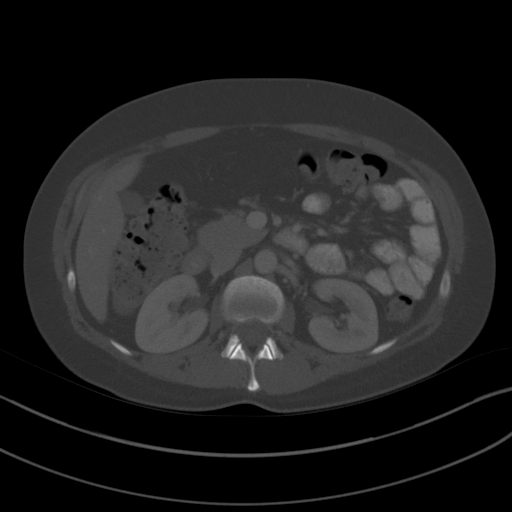
[im 77/98  soft-tissue]
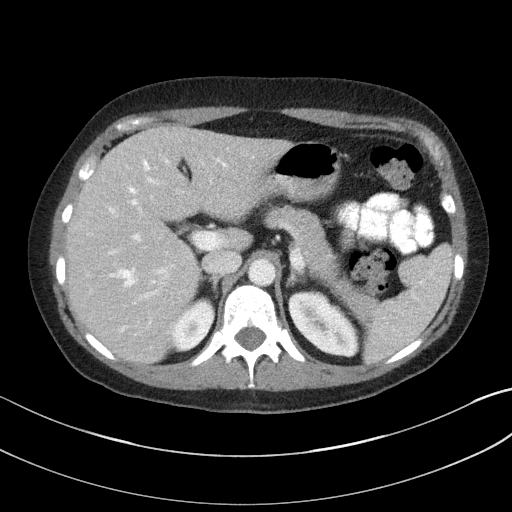
[im 82/98  soft-tissue]
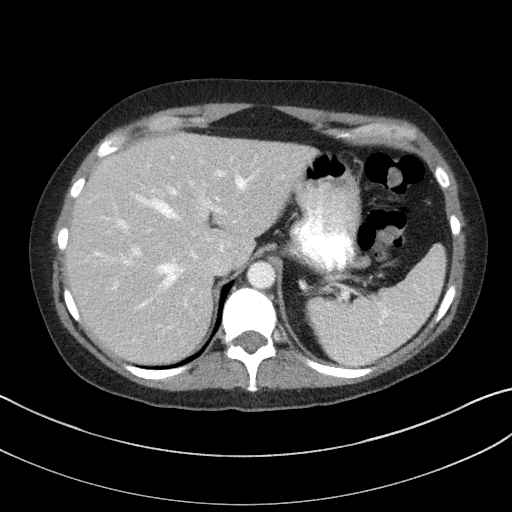
[im 92/98  soft-tissue]
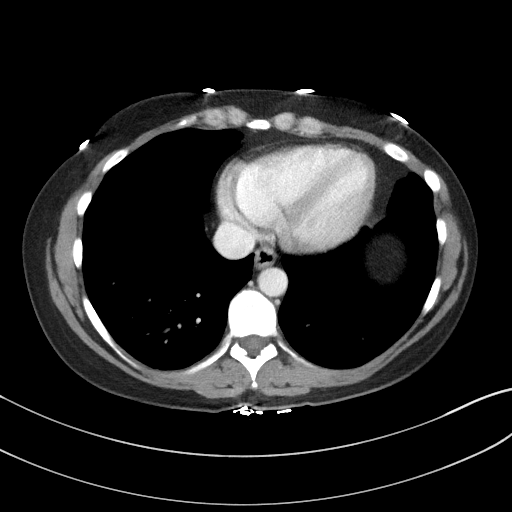

[Series 5: coronal st · coronal · 0.65mm/px · 3 of 70 slices shown]
[im 24/70  soft-tissue]
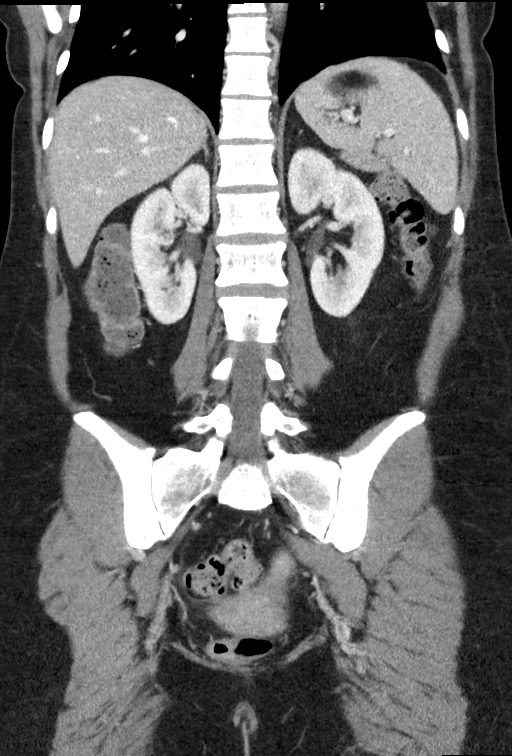
[im 31/70  soft-tissue]
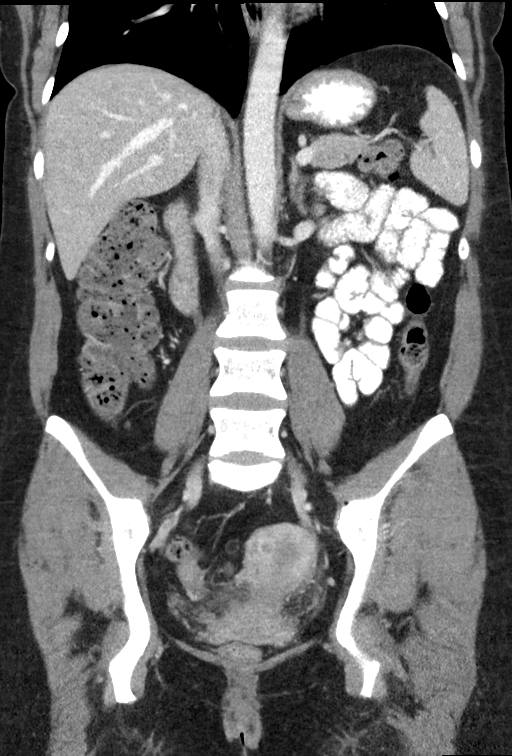
[im 39/70  soft-tissue]
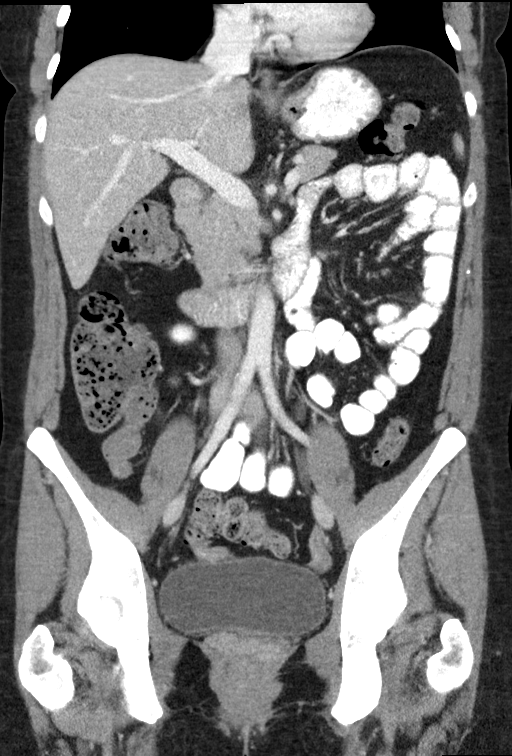

[15 of 46 positions shown; findings below may reference images not displayed]

RADIATION DOSE REDUCTION: This exam was performed according to the
departmental dose-optimization program which includes automated
exposure control, adjustment of the mA and/or kV according to
patient size and/or use of iterative reconstruction technique.

CONTRAST:  100mL OMNIPAQUE IOHEXOL 300 MG/ML  SOLN
FINDINGS: Lower chest: No acute abnormality.

Hepatobiliary: Liver is normal in size and contour. Subcentimeter
hypodensity in the right lobe of the liver is too small to
characterize, most likely a cyst. Gallbladder appears normal. No
biliary ductal dilatation identified.

Pancreas: Unremarkable. No pancreatic ductal dilatation or
surrounding inflammatory changes.

Spleen: Normal in size without focal abnormality.

Adrenals/Urinary Tract: Adrenal glands are unremarkable. Kidneys are
normal, without renal calculi, focal lesion, or hydronephrosis.
Bladder is unremarkable.

Stomach/Bowel: No bowel obstruction, free air or pneumatosis. No
bowel wall edema identified. Moderate to large amount of retained
fecal material throughout the colon. A few colonic diverticula
noted. No evidence of acute appendicitis.

Vascular/Lymphatic: No significant vascular findings are present. No
enlarged abdominal or pelvic lymph nodes.

Reproductive: Uterus and bilateral adnexa are unremarkable.

Other: No ascites.

Musculoskeletal: No acute or significant osseous findings.
IMPRESSION: 1. No acute process identified.
2. A few colonic diverticula noted. Moderate to large amount of
retained fecal material.

## 2022-10-08 ENCOUNTER — Other Ambulatory Visit (HOSPITAL_BASED_OUTPATIENT_CLINIC_OR_DEPARTMENT_OTHER): Payer: Self-pay

## 2022-10-08 ENCOUNTER — Ambulatory Visit (INDEPENDENT_AMBULATORY_CARE_PROVIDER_SITE_OTHER): Payer: Managed Care, Other (non HMO) | Admitting: Sports Medicine

## 2022-10-08 ENCOUNTER — Ambulatory Visit: Payer: Managed Care, Other (non HMO)

## 2022-10-08 DIAGNOSIS — R739 Hyperglycemia, unspecified: Secondary | ICD-10-CM

## 2022-10-08 DIAGNOSIS — M7711 Lateral epicondylitis, right elbow: Secondary | ICD-10-CM | POA: Diagnosis not present

## 2022-10-08 DIAGNOSIS — M25521 Pain in right elbow: Secondary | ICD-10-CM

## 2022-10-08 MED ORDER — MELOXICAM 15 MG PO TABS
15.0000 mg | ORAL_TABLET | ORAL | 3 refills | Status: AC
  Filled 2022-10-08: qty 30, 30d supply, fill #0

## 2022-10-08 NOTE — Progress Notes (Signed)
    Procedures performed today:    None.  Independent interpretation of notes and tests performed by another provider:   None.  Brief History, Exam, Impression, and Recommendations:    Lateral epicondylitis, right elbow This is a pleasant 42 year old female, she has had several months of pain right lateral elbow, has been doing a lot of kettle bells, on exam she has tenderness discretely over the lateral epicondyle. She has a negative Yergason sign ruling out biceps tendinopathy. We will start conservatively, counterforce brace, x-rays, meloxicam, home physical therapy. Return to see me in 6 weeks, interventional treatment if no better.    ____________________________________________ Ihor Austin. Benjamin Stain, M.D., ABFM., CAQSM., AME. Primary Care and Sports Medicine [facility]  Adjunct Professor of Family Medicine  [place] of [facility] [employer]  Restaurant manager, fast food

## 2022-10-08 NOTE — Assessment & Plan Note (Signed)
This is a pleasant 42 year old female, she has had several months of pain right lateral elbow, has been doing a lot of kettle bells, on exam she has tenderness discretely over the lateral epicondyle. She has a negative Yergason sign ruling out biceps tendinopathy. We will start conservatively, counterforce brace, x-rays, meloxicam, home physical therapy. Return to see me in 6 weeks, interventional treatment if no better.

## 2022-11-25 ENCOUNTER — Other Ambulatory Visit (HOSPITAL_BASED_OUTPATIENT_CLINIC_OR_DEPARTMENT_OTHER): Payer: Self-pay

## 2023-02-26 ENCOUNTER — Other Ambulatory Visit (HOSPITAL_BASED_OUTPATIENT_CLINIC_OR_DEPARTMENT_OTHER): Payer: Self-pay

## 2023-03-23 ENCOUNTER — Other Ambulatory Visit (HOSPITAL_BASED_OUTPATIENT_CLINIC_OR_DEPARTMENT_OTHER): Payer: Self-pay

## 2023-03-23 MED ORDER — NORGESTIM-ETH ESTRAD TRIPHASIC 0.18/0.215/0.25 MG-25 MCG PO TABS
1.0000 | ORAL_TABLET | Freq: Every day | ORAL | 0 refills | Status: AC
  Filled 2023-03-23: qty 28, 28d supply, fill #0

## 2023-04-25 ENCOUNTER — Other Ambulatory Visit (HOSPITAL_BASED_OUTPATIENT_CLINIC_OR_DEPARTMENT_OTHER): Payer: Self-pay

## 2023-04-25 MED ORDER — NORGESTIMATE-ETH ESTRADIOL 0.18/0.215/0.25 MG-25 MCG PO TABS
1.0000 | ORAL_TABLET | Freq: Every day | ORAL | 0 refills | Status: DC
  Filled 2023-04-25: qty 28, 28d supply, fill #0

## 2023-05-20 ENCOUNTER — Other Ambulatory Visit (HOSPITAL_BASED_OUTPATIENT_CLINIC_OR_DEPARTMENT_OTHER): Payer: Self-pay

## 2023-05-20 MED ORDER — NORGESTIMATE-ETH ESTRADIOL 0.18/0.215/0.25 MG-25 MCG PO TABS
1.0000 | ORAL_TABLET | Freq: Every day | ORAL | 0 refills | Status: DC
  Filled 2023-05-20 – 2023-05-21 (×2): qty 28, 28d supply, fill #0

## 2023-05-21 ENCOUNTER — Other Ambulatory Visit (HOSPITAL_BASED_OUTPATIENT_CLINIC_OR_DEPARTMENT_OTHER): Payer: Self-pay

## 2023-05-24 ENCOUNTER — Other Ambulatory Visit (HOSPITAL_BASED_OUTPATIENT_CLINIC_OR_DEPARTMENT_OTHER): Payer: Self-pay

## 2023-05-25 ENCOUNTER — Other Ambulatory Visit (HOSPITAL_BASED_OUTPATIENT_CLINIC_OR_DEPARTMENT_OTHER): Payer: Self-pay

## 2023-05-25 MED ORDER — NORGESTIMATE-ETH ESTRADIOL 0.18/0.215/0.25 MG-25 MCG PO TABS
1.0000 | ORAL_TABLET | Freq: Every day | ORAL | 3 refills | Status: DC
  Filled 2023-05-25 – 2023-06-17 (×2): qty 84, 84d supply, fill #0
  Filled 2023-09-09: qty 84, 84d supply, fill #1
  Filled 2023-12-02 – 2023-12-03 (×2): qty 84, 84d supply, fill #2
  Filled 2024-02-23: qty 84, 84d supply, fill #3

## 2023-06-17 ENCOUNTER — Other Ambulatory Visit (HOSPITAL_BASED_OUTPATIENT_CLINIC_OR_DEPARTMENT_OTHER): Payer: Self-pay

## 2023-07-14 ENCOUNTER — Other Ambulatory Visit (HOSPITAL_BASED_OUTPATIENT_CLINIC_OR_DEPARTMENT_OTHER): Payer: Self-pay

## 2023-07-14 ENCOUNTER — Other Ambulatory Visit: Payer: Self-pay | Admitting: Sports Medicine

## 2023-07-14 MED ORDER — OSELTAMIVIR PHOSPHATE 75 MG PO CAPS
75.0000 mg | ORAL_CAPSULE | Freq: Every day | ORAL | 0 refills | Status: AC
  Filled 2023-07-14: qty 7, 7d supply, fill #0

## 2023-07-15 ENCOUNTER — Other Ambulatory Visit (HOSPITAL_BASED_OUTPATIENT_CLINIC_OR_DEPARTMENT_OTHER): Payer: Self-pay

## 2023-09-28 ENCOUNTER — Telehealth: Payer: Self-pay

## 2023-09-28 NOTE — Telephone Encounter (Signed)
 Copied from CRM [id number]. Topic: General - Call Back - No Documentation >> Sep 28, 2023  1:38 PM Shelby Dessert H wrote: Reason for CRM: Patient called and stated that Dr. Rubye Corpus nurse called and left her a voice message just stating to return the call with no details, looked in her chart and there was no phone message, patients callback number is [phone].

## 2023-09-28 NOTE — Telephone Encounter (Signed)
 I attempted to reach out regarding her son, [name]', test results. VM was left with office phone number for a call back. I have now been able to contact Miss Debbie Hayden and advise of results regarding her son [name].

## 2023-12-03 ENCOUNTER — Other Ambulatory Visit (HOSPITAL_BASED_OUTPATIENT_CLINIC_OR_DEPARTMENT_OTHER): Payer: Self-pay

## 2024-01-17 ENCOUNTER — Encounter: Payer: Self-pay | Admitting: Sports Medicine

## 2024-05-08 ENCOUNTER — Other Ambulatory Visit (HOSPITAL_BASED_OUTPATIENT_CLINIC_OR_DEPARTMENT_OTHER): Payer: Self-pay

## 2024-05-08 MED ORDER — NORGESTIMATE-ETH ESTRADIOL 0.18/0.215/0.25 MG-25 MCG PO TABS
1.0000 | ORAL_TABLET | Freq: Every day | ORAL | 0 refills | Status: AC
  Filled 2024-05-08: qty 84, 84d supply, fill #0
